# Patient Record
Sex: Female | Born: 1935 | Race: White | Hispanic: No | Smoking: Former smoker
Health system: Southern US, Community
[De-identification: ages and names within clinical notes are randomized; demographics above are authoritative.]

## PROBLEM LIST (undated history)

## (undated) DIAGNOSIS — E079 Disorder of thyroid, unspecified: Secondary | ICD-10-CM

## (undated) DIAGNOSIS — F32A Depression, unspecified: Secondary | ICD-10-CM

## (undated) DIAGNOSIS — M1990 Unspecified osteoarthritis, unspecified site: Secondary | ICD-10-CM

## (undated) DIAGNOSIS — U071 COVID-19: Secondary | ICD-10-CM

## (undated) DIAGNOSIS — I10 Essential (primary) hypertension: Secondary | ICD-10-CM

## (undated) DIAGNOSIS — E119 Type 2 diabetes mellitus without complications: Secondary | ICD-10-CM

## (undated) DIAGNOSIS — M549 Dorsalgia, unspecified: Secondary | ICD-10-CM

## (undated) DIAGNOSIS — F419 Anxiety disorder, unspecified: Secondary | ICD-10-CM

## (undated) DIAGNOSIS — N816 Rectocele: Secondary | ICD-10-CM

## (undated) DIAGNOSIS — K5900 Constipation, unspecified: Secondary | ICD-10-CM

## (undated) DIAGNOSIS — E785 Hyperlipidemia, unspecified: Secondary | ICD-10-CM

## (undated) DIAGNOSIS — K219 Gastro-esophageal reflux disease without esophagitis: Secondary | ICD-10-CM

## (undated) DIAGNOSIS — IMO0002 Reserved for concepts with insufficient information to code with codable children: Secondary | ICD-10-CM

## (undated) DIAGNOSIS — F329 Major depressive disorder, single episode, unspecified: Secondary | ICD-10-CM

## (undated) HISTORY — DX: Depression, unspecified: F32.A

## (undated) HISTORY — DX: Essential (primary) hypertension: I10

## (undated) HISTORY — DX: Unspecified osteoarthritis, unspecified site: M19.90

## (undated) HISTORY — DX: Disorder of thyroid, unspecified: E07.9

## (undated) HISTORY — DX: Dorsalgia, unspecified: M54.9

## (undated) HISTORY — DX: Gastro-esophageal reflux disease without esophagitis: K21.9

## (undated) HISTORY — DX: Major depressive disorder, single episode, unspecified: F32.9

## (undated) HISTORY — DX: Constipation, unspecified: K59.00

## (undated) HISTORY — DX: Rectocele: N81.6

## (undated) HISTORY — PX: CATARACT EXTRACTION: SUR2

## (undated) HISTORY — DX: Anxiety disorder, unspecified: F41.9

## (undated) HISTORY — DX: Hyperlipidemia, unspecified: E78.5

## (undated) HISTORY — DX: Type 2 diabetes mellitus without complications: E11.9

## (undated) HISTORY — DX: Reserved for concepts with insufficient information to code with codable children: IMO0002

## (undated) HISTORY — PX: ABDOMINAL HYSTERECTOMY: SHX81

---

## 2002-06-02 HISTORY — PX: COLONOSCOPY: SHX174

## 2002-12-13 ENCOUNTER — Encounter: Payer: Self-pay | Admitting: Family Medicine

## 2002-12-13 ENCOUNTER — Ambulatory Visit (HOSPITAL_COMMUNITY): Admission: RE | Admit: 2002-12-13 | Discharge: 2002-12-13 | Payer: Self-pay | Admitting: Family Medicine

## 2003-04-06 ENCOUNTER — Ambulatory Visit (HOSPITAL_COMMUNITY): Admission: RE | Admit: 2003-04-06 | Discharge: 2003-04-06 | Payer: Self-pay | Admitting: Family Medicine

## 2003-04-14 ENCOUNTER — Ambulatory Visit (HOSPITAL_COMMUNITY): Admission: RE | Admit: 2003-04-14 | Discharge: 2003-04-14 | Payer: Self-pay | Admitting: Family Medicine

## 2003-05-24 ENCOUNTER — Ambulatory Visit (HOSPITAL_COMMUNITY): Admission: RE | Admit: 2003-05-24 | Discharge: 2003-05-24 | Payer: Self-pay | Admitting: Internal Medicine

## 2003-05-24 ENCOUNTER — Encounter (INDEPENDENT_AMBULATORY_CARE_PROVIDER_SITE_OTHER): Payer: Self-pay | Admitting: Internal Medicine

## 2003-10-09 ENCOUNTER — Ambulatory Visit (HOSPITAL_COMMUNITY): Admission: RE | Admit: 2003-10-09 | Discharge: 2003-10-09 | Payer: Self-pay | Admitting: Family Medicine

## 2003-12-14 ENCOUNTER — Ambulatory Visit (HOSPITAL_COMMUNITY): Admission: RE | Admit: 2003-12-14 | Discharge: 2003-12-14 | Payer: Self-pay | Admitting: Family Medicine

## 2004-04-04 ENCOUNTER — Ambulatory Visit (HOSPITAL_COMMUNITY): Admission: RE | Admit: 2004-04-04 | Discharge: 2004-04-04 | Payer: Self-pay | Admitting: Internal Medicine

## 2004-05-31 ENCOUNTER — Ambulatory Visit (HOSPITAL_COMMUNITY): Admission: RE | Admit: 2004-05-31 | Discharge: 2004-05-31 | Payer: Self-pay | Admitting: Internal Medicine

## 2004-09-24 ENCOUNTER — Ambulatory Visit (HOSPITAL_COMMUNITY): Admission: RE | Admit: 2004-09-24 | Discharge: 2004-09-24 | Payer: Self-pay | Admitting: Internal Medicine

## 2004-11-30 ENCOUNTER — Encounter (INDEPENDENT_AMBULATORY_CARE_PROVIDER_SITE_OTHER): Payer: Self-pay | Admitting: Internal Medicine

## 2004-11-30 LAB — CONVERTED CEMR LAB: Pap Smear: NORMAL

## 2004-12-19 ENCOUNTER — Ambulatory Visit: Payer: Self-pay | Admitting: Family Medicine

## 2004-12-20 ENCOUNTER — Ambulatory Visit (HOSPITAL_COMMUNITY): Admission: RE | Admit: 2004-12-20 | Discharge: 2004-12-20 | Payer: Self-pay | Admitting: Family Medicine

## 2004-12-24 ENCOUNTER — Ambulatory Visit (HOSPITAL_COMMUNITY): Admission: RE | Admit: 2004-12-24 | Discharge: 2004-12-24 | Payer: Self-pay | Admitting: Family Medicine

## 2004-12-26 ENCOUNTER — Encounter (INDEPENDENT_AMBULATORY_CARE_PROVIDER_SITE_OTHER): Payer: Self-pay | Admitting: Internal Medicine

## 2004-12-26 LAB — CONVERTED CEMR LAB: TSH: 3.325 microintl units/mL

## 2005-01-21 ENCOUNTER — Ambulatory Visit: Payer: Self-pay | Admitting: Family Medicine

## 2005-02-24 ENCOUNTER — Ambulatory Visit: Payer: Self-pay | Admitting: Family Medicine

## 2005-02-24 ENCOUNTER — Ambulatory Visit (HOSPITAL_COMMUNITY): Admission: RE | Admit: 2005-02-24 | Discharge: 2005-02-24 | Payer: Self-pay | Admitting: Family Medicine

## 2005-03-27 ENCOUNTER — Ambulatory Visit: Payer: Self-pay | Admitting: Family Medicine

## 2005-04-22 ENCOUNTER — Ambulatory Visit: Payer: Self-pay | Admitting: Family Medicine

## 2005-05-19 ENCOUNTER — Ambulatory Visit: Payer: Self-pay | Admitting: Family Medicine

## 2005-09-04 ENCOUNTER — Ambulatory Visit: Payer: Self-pay | Admitting: Internal Medicine

## 2005-10-02 ENCOUNTER — Ambulatory Visit: Payer: Self-pay | Admitting: Internal Medicine

## 2005-10-07 ENCOUNTER — Encounter (INDEPENDENT_AMBULATORY_CARE_PROVIDER_SITE_OTHER): Payer: Self-pay | Admitting: Internal Medicine

## 2005-10-16 ENCOUNTER — Ambulatory Visit: Payer: Self-pay | Admitting: Internal Medicine

## 2005-11-13 ENCOUNTER — Ambulatory Visit: Payer: Self-pay | Admitting: Internal Medicine

## 2005-12-01 ENCOUNTER — Ambulatory Visit: Payer: Self-pay | Admitting: Internal Medicine

## 2005-12-01 LAB — CONVERTED CEMR LAB
BUN: 18 mg/dL
CO2: 29 meq/L
Calcium: 9.4 mg/dL
Chloride: 100 meq/L
Cholesterol: 186 mg/dL
Creatinine, Ser: 0.81 mg/dL
Glucose, Bld: 115 mg/dL
HDL: 44 mg/dL
LDL Cholesterol: 122 mg/dL
Potassium: 4.1 meq/L
Sodium: 141 meq/L
Total CHOL/HDL Ratio: 4.2
Triglycerides: 101 mg/dL
VLDL: 20 mg/dL

## 2005-12-25 ENCOUNTER — Encounter (INDEPENDENT_AMBULATORY_CARE_PROVIDER_SITE_OTHER): Payer: Self-pay | Admitting: Internal Medicine

## 2005-12-25 ENCOUNTER — Ambulatory Visit (HOSPITAL_COMMUNITY): Admission: RE | Admit: 2005-12-25 | Discharge: 2005-12-25 | Payer: Self-pay | Admitting: Internal Medicine

## 2006-02-16 ENCOUNTER — Ambulatory Visit: Payer: Self-pay | Admitting: Internal Medicine

## 2006-03-05 ENCOUNTER — Ambulatory Visit: Payer: Self-pay | Admitting: Internal Medicine

## 2006-03-17 ENCOUNTER — Ambulatory Visit: Payer: Self-pay | Admitting: Internal Medicine

## 2006-03-17 LAB — CONVERTED CEMR LAB: Blood Glucose, Fasting: 126 mg/dL

## 2006-06-06 ENCOUNTER — Encounter: Payer: Self-pay | Admitting: Internal Medicine

## 2006-06-06 DIAGNOSIS — I10 Essential (primary) hypertension: Secondary | ICD-10-CM | POA: Insufficient documentation

## 2006-06-06 DIAGNOSIS — Z8639 Personal history of other endocrine, nutritional and metabolic disease: Secondary | ICD-10-CM

## 2006-06-06 DIAGNOSIS — K5900 Constipation, unspecified: Secondary | ICD-10-CM | POA: Insufficient documentation

## 2006-06-06 DIAGNOSIS — M129 Arthropathy, unspecified: Secondary | ICD-10-CM | POA: Insufficient documentation

## 2006-06-06 DIAGNOSIS — H269 Unspecified cataract: Secondary | ICD-10-CM

## 2006-06-06 DIAGNOSIS — M545 Low back pain: Secondary | ICD-10-CM

## 2006-06-06 DIAGNOSIS — K219 Gastro-esophageal reflux disease without esophagitis: Secondary | ICD-10-CM

## 2006-06-06 DIAGNOSIS — N6019 Diffuse cystic mastopathy of unspecified breast: Secondary | ICD-10-CM

## 2006-06-06 DIAGNOSIS — F3289 Other specified depressive episodes: Secondary | ICD-10-CM | POA: Insufficient documentation

## 2006-06-06 DIAGNOSIS — K589 Irritable bowel syndrome without diarrhea: Secondary | ICD-10-CM

## 2006-06-06 DIAGNOSIS — F411 Generalized anxiety disorder: Secondary | ICD-10-CM | POA: Insufficient documentation

## 2006-06-06 DIAGNOSIS — H3530 Unspecified macular degeneration: Secondary | ICD-10-CM | POA: Insufficient documentation

## 2006-06-06 DIAGNOSIS — F329 Major depressive disorder, single episode, unspecified: Secondary | ICD-10-CM

## 2006-06-06 DIAGNOSIS — Z862 Personal history of diseases of the blood and blood-forming organs and certain disorders involving the immune mechanism: Secondary | ICD-10-CM

## 2006-06-06 DIAGNOSIS — J40 Bronchitis, not specified as acute or chronic: Secondary | ICD-10-CM | POA: Insufficient documentation

## 2006-06-09 ENCOUNTER — Ambulatory Visit: Payer: Self-pay | Admitting: Internal Medicine

## 2006-09-10 ENCOUNTER — Encounter (INDEPENDENT_AMBULATORY_CARE_PROVIDER_SITE_OTHER): Payer: Self-pay | Admitting: Internal Medicine

## 2006-10-12 ENCOUNTER — Telehealth (INDEPENDENT_AMBULATORY_CARE_PROVIDER_SITE_OTHER): Payer: Self-pay | Admitting: *Deleted

## 2006-10-14 ENCOUNTER — Ambulatory Visit: Payer: Self-pay | Admitting: Internal Medicine

## 2006-11-11 ENCOUNTER — Ambulatory Visit: Payer: Self-pay | Admitting: Internal Medicine

## 2006-11-12 LAB — CONVERTED CEMR LAB
BUN: 19 mg/dL (ref 6–23)
Basophils Absolute: 0 10*3/uL (ref 0.0–0.1)
Basophils Relative: 0 % (ref 0–1)
CO2: 25 meq/L (ref 19–32)
Calcium: 9.3 mg/dL (ref 8.4–10.5)
Chloride: 104 meq/L (ref 96–112)
Cholesterol: 162 mg/dL (ref 0–200)
Creatinine, Ser: 0.82 mg/dL (ref 0.40–1.20)
Eosinophils Absolute: 0.2 10*3/uL (ref 0.0–0.7)
Eosinophils Relative: 2 % (ref 0–5)
Glucose, Bld: 111 mg/dL — ABNORMAL HIGH (ref 70–99)
HCT: 44.3 % (ref 36.0–46.0)
HDL: 47 mg/dL (ref >39)
Hemoglobin: 14.2 g/dL (ref 12.0–15.0)
LDL Cholesterol: 98 mg/dL (ref 0–99)
Lymphocytes Relative: 28 % (ref 12–46)
Lymphs Abs: 2 10*3/uL (ref 0.7–3.3)
MCHC: 32.1 g/dL (ref 30.0–36.0)
MCV: 85.7 fL (ref 78.0–100.0)
Monocytes Absolute: 0.5 10*3/uL (ref 0.2–0.7)
Monocytes Relative: 7 % (ref 3–11)
Neutro Abs: 4.3 10*3/uL (ref 1.7–7.7)
Neutrophils Relative %: 62 % (ref 43–77)
Platelets: 193 10*3/uL (ref 150–400)
Potassium: 4.5 meq/L (ref 3.5–5.3)
RBC: 5.17 M/uL — ABNORMAL HIGH (ref 3.87–5.11)
RDW: 14.5 % — ABNORMAL HIGH (ref 11.5–14.0)
Sodium: 141 meq/L (ref 135–145)
Total CHOL/HDL Ratio: 3.4
Triglycerides: 87 mg/dL (ref <150)
VLDL: 17 mg/dL (ref 0–40)
WBC: 7 10*3/uL (ref 4.0–10.5)

## 2006-11-26 ENCOUNTER — Telehealth (INDEPENDENT_AMBULATORY_CARE_PROVIDER_SITE_OTHER): Payer: Self-pay | Admitting: *Deleted

## 2006-12-08 ENCOUNTER — Encounter (INDEPENDENT_AMBULATORY_CARE_PROVIDER_SITE_OTHER): Payer: Self-pay | Admitting: Internal Medicine

## 2006-12-08 ENCOUNTER — Ambulatory Visit: Payer: Self-pay | Admitting: Family Medicine

## 2006-12-08 DIAGNOSIS — R0789 Other chest pain: Secondary | ICD-10-CM

## 2006-12-08 LAB — CONVERTED CEMR LAB
Bilirubin Urine: NEGATIVE
Blood in Urine, dipstick: NEGATIVE
Glucose, Urine, Semiquant: NEGATIVE
Ketones, urine, test strip: NEGATIVE
Nitrite: NEGATIVE
Protein, U semiquant: NEGATIVE
Specific Gravity, Urine: >=1.03
Urobilinogen, UA: 0.2
pH: 5.5

## 2006-12-10 ENCOUNTER — Ambulatory Visit: Payer: Self-pay | Admitting: Internal Medicine

## 2006-12-14 ENCOUNTER — Telehealth (INDEPENDENT_AMBULATORY_CARE_PROVIDER_SITE_OTHER): Payer: Self-pay | Admitting: *Deleted

## 2006-12-22 ENCOUNTER — Ambulatory Visit: Payer: Self-pay | Admitting: Internal Medicine

## 2006-12-28 ENCOUNTER — Encounter (INDEPENDENT_AMBULATORY_CARE_PROVIDER_SITE_OTHER): Payer: Self-pay | Admitting: Family Medicine

## 2006-12-28 ENCOUNTER — Ambulatory Visit (HOSPITAL_COMMUNITY): Admission: RE | Admit: 2006-12-28 | Discharge: 2006-12-28 | Payer: Self-pay | Admitting: Family Medicine

## 2006-12-29 ENCOUNTER — Telehealth (INDEPENDENT_AMBULATORY_CARE_PROVIDER_SITE_OTHER): Payer: Self-pay | Admitting: *Deleted

## 2007-02-02 ENCOUNTER — Ambulatory Visit: Payer: Self-pay | Admitting: Internal Medicine

## 2007-02-03 ENCOUNTER — Ambulatory Visit (HOSPITAL_COMMUNITY): Admission: RE | Admit: 2007-02-03 | Discharge: 2007-02-03 | Payer: Self-pay | Admitting: Internal Medicine

## 2007-02-05 ENCOUNTER — Ambulatory Visit: Payer: Self-pay | Admitting: Internal Medicine

## 2007-02-10 ENCOUNTER — Encounter (INDEPENDENT_AMBULATORY_CARE_PROVIDER_SITE_OTHER): Payer: Self-pay | Admitting: Internal Medicine

## 2007-03-02 ENCOUNTER — Ambulatory Visit: Payer: Self-pay | Admitting: Internal Medicine

## 2007-04-08 ENCOUNTER — Ambulatory Visit: Payer: Self-pay | Admitting: Internal Medicine

## 2007-04-08 LAB — CONVERTED CEMR LAB
Bilirubin Urine: NEGATIVE
Blood in Urine, dipstick: NEGATIVE
Glucose, Urine, Semiquant: NEGATIVE
Nitrite: NEGATIVE
Protein, U semiquant: NEGATIVE
Specific Gravity, Urine: >=1.03
Urobilinogen, UA: 0.2
pH: 5

## 2007-04-13 ENCOUNTER — Ambulatory Visit (HOSPITAL_COMMUNITY): Admission: RE | Admit: 2007-04-13 | Discharge: 2007-04-13 | Payer: Self-pay | Admitting: Ophthalmology

## 2007-05-21 ENCOUNTER — Encounter (INDEPENDENT_AMBULATORY_CARE_PROVIDER_SITE_OTHER): Payer: Self-pay | Admitting: Internal Medicine

## 2007-05-24 ENCOUNTER — Telehealth (INDEPENDENT_AMBULATORY_CARE_PROVIDER_SITE_OTHER): Payer: Self-pay | Admitting: *Deleted

## 2007-05-24 LAB — CONVERTED CEMR LAB
BUN: 16 mg/dL (ref 6–23)
Calcium: 9.7 mg/dL (ref 8.4–10.5)
Creatinine, Ser: 0.82 mg/dL (ref 0.40–1.20)

## 2007-06-03 ENCOUNTER — Encounter: Payer: Self-pay | Admitting: Family Medicine

## 2007-07-01 ENCOUNTER — Ambulatory Visit: Payer: Self-pay | Admitting: Internal Medicine

## 2007-07-01 DIAGNOSIS — B029 Zoster without complications: Secondary | ICD-10-CM | POA: Insufficient documentation

## 2007-07-05 ENCOUNTER — Telehealth (INDEPENDENT_AMBULATORY_CARE_PROVIDER_SITE_OTHER): Payer: Self-pay | Admitting: Internal Medicine

## 2007-07-13 ENCOUNTER — Ambulatory Visit: Payer: Self-pay | Admitting: Family Medicine

## 2007-07-16 ENCOUNTER — Ambulatory Visit: Payer: Self-pay | Admitting: Internal Medicine

## 2007-07-16 DIAGNOSIS — E861 Hypovolemia: Secondary | ICD-10-CM

## 2007-07-30 ENCOUNTER — Ambulatory Visit: Payer: Self-pay | Admitting: Internal Medicine

## 2007-08-11 ENCOUNTER — Ambulatory Visit: Payer: Self-pay | Admitting: Internal Medicine

## 2007-09-07 ENCOUNTER — Ambulatory Visit: Payer: Self-pay | Admitting: Internal Medicine

## 2007-10-07 ENCOUNTER — Ambulatory Visit: Payer: Self-pay | Admitting: Internal Medicine

## 2007-11-18 ENCOUNTER — Ambulatory Visit: Payer: Self-pay | Admitting: Internal Medicine

## 2007-12-30 ENCOUNTER — Ambulatory Visit: Payer: Self-pay | Admitting: Internal Medicine

## 2007-12-30 ENCOUNTER — Ambulatory Visit (HOSPITAL_COMMUNITY): Admission: RE | Admit: 2007-12-30 | Discharge: 2007-12-30 | Payer: Self-pay | Admitting: Internal Medicine

## 2007-12-30 LAB — CONVERTED CEMR LAB: Hgb A1c MFr Bld: 6.7 %

## 2008-01-10 ENCOUNTER — Telehealth (INDEPENDENT_AMBULATORY_CARE_PROVIDER_SITE_OTHER): Payer: Self-pay | Admitting: Internal Medicine

## 2008-01-10 ENCOUNTER — Ambulatory Visit (HOSPITAL_COMMUNITY): Admission: RE | Admit: 2008-01-10 | Discharge: 2008-01-10 | Payer: Self-pay | Admitting: Internal Medicine

## 2008-01-11 ENCOUNTER — Encounter (INDEPENDENT_AMBULATORY_CARE_PROVIDER_SITE_OTHER): Payer: Self-pay | Admitting: Internal Medicine

## 2008-01-11 ENCOUNTER — Encounter (INDEPENDENT_AMBULATORY_CARE_PROVIDER_SITE_OTHER): Payer: Self-pay | Admitting: Diagnostic Radiology

## 2008-01-11 ENCOUNTER — Encounter: Admission: RE | Admit: 2008-01-11 | Discharge: 2008-01-11 | Payer: Self-pay | Admitting: Internal Medicine

## 2008-01-17 ENCOUNTER — Encounter (INDEPENDENT_AMBULATORY_CARE_PROVIDER_SITE_OTHER): Payer: Self-pay | Admitting: Internal Medicine

## 2008-01-18 ENCOUNTER — Ambulatory Visit: Payer: Self-pay | Admitting: Internal Medicine

## 2008-02-17 ENCOUNTER — Ambulatory Visit: Payer: Self-pay | Admitting: Internal Medicine

## 2008-03-30 ENCOUNTER — Ambulatory Visit: Payer: Self-pay | Admitting: Internal Medicine

## 2008-03-30 DIAGNOSIS — E1149 Type 2 diabetes mellitus with other diabetic neurological complication: Secondary | ICD-10-CM

## 2008-04-04 ENCOUNTER — Encounter (INDEPENDENT_AMBULATORY_CARE_PROVIDER_SITE_OTHER): Payer: Self-pay | Admitting: Internal Medicine

## 2008-04-11 ENCOUNTER — Encounter (INDEPENDENT_AMBULATORY_CARE_PROVIDER_SITE_OTHER): Payer: Self-pay | Admitting: Internal Medicine

## 2008-07-04 ENCOUNTER — Encounter (INDEPENDENT_AMBULATORY_CARE_PROVIDER_SITE_OTHER): Payer: Self-pay | Admitting: Internal Medicine

## 2008-07-10 ENCOUNTER — Ambulatory Visit: Payer: Self-pay | Admitting: Internal Medicine

## 2008-07-10 DIAGNOSIS — R5381 Other malaise: Secondary | ICD-10-CM

## 2008-07-10 DIAGNOSIS — R5383 Other fatigue: Secondary | ICD-10-CM

## 2008-07-10 LAB — CONVERTED CEMR LAB: Hgb A1c MFr Bld: 6.7 %

## 2008-07-11 LAB — CONVERTED CEMR LAB
Alkaline Phosphatase: 103 units/L (ref 39–117)
BUN: 14 mg/dL (ref 6–23)
CO2: 25 meq/L (ref 19–32)
Cholesterol: 157 mg/dL (ref 0–200)
Creatinine, Ser: 0.8 mg/dL (ref 0.40–1.20)
Eosinophils Absolute: 0.1 10*3/uL (ref 0.0–0.7)
Eosinophils Relative: 2 % (ref 0–5)
Glucose, Bld: 90 mg/dL (ref 70–99)
HCT: 45.1 % (ref 36.0–46.0)
HDL: 46 mg/dL (ref >39)
Hemoglobin: 14.9 g/dL (ref 12.0–15.0)
LDL Cholesterol: 86 mg/dL (ref 0–99)
Lymphocytes Relative: 26 % (ref 12–46)
Lymphs Abs: 1.9 10*3/uL (ref 0.7–4.0)
MCV: 84.5 fL (ref 78.0–100.0)
Monocytes Absolute: 0.5 10*3/uL (ref 0.1–1.0)
Monocytes Relative: 6 % (ref 3–12)
Platelets: 214 10*3/uL (ref 150–400)
RBC: 5.34 M/uL — ABNORMAL HIGH (ref 3.87–5.11)
Total Bilirubin: 0.5 mg/dL (ref 0.3–1.2)
Total Protein: 7.3 g/dL (ref 6.0–8.3)
Triglycerides: 125 mg/dL (ref <150)
VLDL: 25 mg/dL (ref 0–40)
WBC: 7.4 10*3/uL (ref 4.0–10.5)

## 2008-07-18 ENCOUNTER — Ambulatory Visit: Payer: Self-pay | Admitting: Internal Medicine

## 2008-07-18 DIAGNOSIS — R300 Dysuria: Secondary | ICD-10-CM

## 2008-07-18 DIAGNOSIS — G4700 Insomnia, unspecified: Secondary | ICD-10-CM

## 2008-07-18 LAB — CONVERTED CEMR LAB
Amphetamine Screen, Ur: NEGATIVE
Benzodiazepines.: NEGATIVE
Bilirubin Urine: NEGATIVE
Marijuana Metabolite: NEGATIVE
Nitrite: NEGATIVE
Opiate Screen, Urine: NEGATIVE
Phencyclidine (PCP): NEGATIVE
Urobilinogen, UA: NEGATIVE

## 2008-07-19 ENCOUNTER — Encounter (INDEPENDENT_AMBULATORY_CARE_PROVIDER_SITE_OTHER): Payer: Self-pay | Admitting: Internal Medicine

## 2008-08-07 ENCOUNTER — Ambulatory Visit: Payer: Self-pay | Admitting: Internal Medicine

## 2008-08-16 ENCOUNTER — Telehealth (INDEPENDENT_AMBULATORY_CARE_PROVIDER_SITE_OTHER): Payer: Self-pay | Admitting: Internal Medicine

## 2008-08-19 ENCOUNTER — Emergency Department (HOSPITAL_COMMUNITY): Admission: EM | Admit: 2008-08-19 | Discharge: 2008-08-19 | Payer: Self-pay | Admitting: Emergency Medicine

## 2008-08-21 ENCOUNTER — Ambulatory Visit: Payer: Self-pay | Admitting: Internal Medicine

## 2008-08-30 ENCOUNTER — Ambulatory Visit: Payer: Self-pay | Admitting: Internal Medicine

## 2008-09-04 ENCOUNTER — Ambulatory Visit: Payer: Self-pay | Admitting: Internal Medicine

## 2008-09-12 ENCOUNTER — Telehealth (INDEPENDENT_AMBULATORY_CARE_PROVIDER_SITE_OTHER): Payer: Self-pay | Admitting: Internal Medicine

## 2008-09-27 ENCOUNTER — Ambulatory Visit: Payer: Self-pay | Admitting: Internal Medicine

## 2008-10-11 ENCOUNTER — Ambulatory Visit: Payer: Self-pay | Admitting: Internal Medicine

## 2008-10-12 ENCOUNTER — Telehealth (INDEPENDENT_AMBULATORY_CARE_PROVIDER_SITE_OTHER): Payer: Self-pay | Admitting: Internal Medicine

## 2008-10-25 ENCOUNTER — Ambulatory Visit: Payer: Self-pay | Admitting: Internal Medicine

## 2008-10-26 ENCOUNTER — Encounter (INDEPENDENT_AMBULATORY_CARE_PROVIDER_SITE_OTHER): Payer: Self-pay | Admitting: Internal Medicine

## 2008-10-26 LAB — CONVERTED CEMR LAB
ALT: 16 units/L (ref 0–35)
BUN: 15 mg/dL (ref 6–23)
Basophils Relative: 0 % (ref 0–1)
CO2: 27 meq/L (ref 19–32)
Calcium: 9.2 mg/dL (ref 8.4–10.5)
Chloride: 105 meq/L (ref 96–112)
Creatinine, Ser: 0.78 mg/dL (ref 0.40–1.20)
Eosinophils Absolute: 0.2 10*3/uL (ref 0.0–0.7)
Eosinophils Relative: 2 % (ref 0–5)
Free T4: 0.91 ng/dL (ref 0.80–1.80)
Glucose, Bld: 102 mg/dL — ABNORMAL HIGH (ref 70–99)
HCT: 45 % (ref 36.0–46.0)
Lymphs Abs: 2.2 10*3/uL (ref 0.7–4.0)
MCHC: 33.1 g/dL (ref 30.0–36.0)
MCV: 84.6 fL (ref 78.0–100.0)
Monocytes Absolute: 0.5 10*3/uL (ref 0.1–1.0)
Monocytes Relative: 6 % (ref 3–12)
RBC: 5.32 M/uL — ABNORMAL HIGH (ref 3.87–5.11)
TSH: 2.516 microintl units/mL (ref 0.350–4.500)
WBC: 7.9 10*3/uL (ref 4.0–10.5)

## 2008-11-08 ENCOUNTER — Ambulatory Visit: Payer: Self-pay | Admitting: Internal Medicine

## 2008-11-08 LAB — CONVERTED CEMR LAB
Blood Glucose, AC Bkfst: 86 mg/dL
Hgb A1c MFr Bld: 6.7 %

## 2008-11-27 ENCOUNTER — Ambulatory Visit: Payer: Self-pay | Admitting: Internal Medicine

## 2008-12-01 ENCOUNTER — Encounter (INDEPENDENT_AMBULATORY_CARE_PROVIDER_SITE_OTHER): Payer: Self-pay | Admitting: *Deleted

## 2008-12-21 DIAGNOSIS — R109 Unspecified abdominal pain: Secondary | ICD-10-CM | POA: Insufficient documentation

## 2008-12-25 ENCOUNTER — Ambulatory Visit: Payer: Self-pay | Admitting: Internal Medicine

## 2008-12-28 ENCOUNTER — Ambulatory Visit: Payer: Self-pay | Admitting: Internal Medicine

## 2008-12-29 ENCOUNTER — Encounter: Payer: Self-pay | Admitting: Internal Medicine

## 2009-01-01 ENCOUNTER — Encounter: Payer: Self-pay | Admitting: Internal Medicine

## 2009-01-01 ENCOUNTER — Ambulatory Visit (HOSPITAL_COMMUNITY): Admission: RE | Admit: 2009-01-01 | Discharge: 2009-01-01 | Payer: Self-pay | Admitting: Internal Medicine

## 2009-01-01 ENCOUNTER — Encounter (INDEPENDENT_AMBULATORY_CARE_PROVIDER_SITE_OTHER): Payer: Self-pay | Admitting: Internal Medicine

## 2009-01-01 ENCOUNTER — Ambulatory Visit: Payer: Self-pay | Admitting: Internal Medicine

## 2009-01-01 HISTORY — PX: ESOPHAGOGASTRODUODENOSCOPY: SHX1529

## 2009-01-03 ENCOUNTER — Ambulatory Visit (HOSPITAL_COMMUNITY): Admission: RE | Admit: 2009-01-03 | Discharge: 2009-01-03 | Payer: Self-pay | Admitting: Internal Medicine

## 2009-01-04 ENCOUNTER — Encounter (INDEPENDENT_AMBULATORY_CARE_PROVIDER_SITE_OTHER): Payer: Self-pay | Admitting: *Deleted

## 2009-01-04 ENCOUNTER — Encounter: Payer: Self-pay | Admitting: Internal Medicine

## 2009-01-22 ENCOUNTER — Ambulatory Visit: Payer: Self-pay | Admitting: Internal Medicine

## 2009-01-30 ENCOUNTER — Ambulatory Visit (HOSPITAL_COMMUNITY): Admission: RE | Admit: 2009-01-30 | Discharge: 2009-01-30 | Payer: Self-pay | Admitting: Family Medicine

## 2009-02-13 ENCOUNTER — Ambulatory Visit: Payer: Self-pay | Admitting: Internal Medicine

## 2009-02-14 DIAGNOSIS — K3184 Gastroparesis: Secondary | ICD-10-CM

## 2010-01-31 ENCOUNTER — Ambulatory Visit (HOSPITAL_COMMUNITY): Admission: RE | Admit: 2010-01-31 | Discharge: 2010-01-31 | Payer: Self-pay | Admitting: Family Medicine

## 2010-06-24 ENCOUNTER — Encounter: Payer: Self-pay | Admitting: Internal Medicine

## 2010-06-30 LAB — CONVERTED CEMR LAB
BUN: 15 mg/dL (ref 6–23)
Basophils Relative: 0 % (ref 0–1)
CO2: 25 meq/L (ref 19–32)
Chloride: 103 meq/L (ref 96–112)
Glucose, Bld: 133 mg/dL — ABNORMAL HIGH (ref 70–99)
Hemoglobin: 14 g/dL (ref 12.0–15.0)
Hgb A1c MFr Bld: 6.4 %
Ketones, urine, test strip: NEGATIVE
LDL Cholesterol: 107 mg/dL — ABNORMAL HIGH (ref 0–99)
Lymphocytes Relative: 33 % (ref 12–46)
Lymphs Abs: 1.9 10*3/uL (ref 0.7–4.0)
Microalb Creat Ratio: 12.9 mg/g (ref 0.0–30.0)
Monocytes Relative: 7 % (ref 3–12)
Neutro Abs: 3.1 10*3/uL (ref 1.7–7.7)
Neutrophils Relative %: 55 % (ref 43–77)
Potassium: 4.3 meq/L (ref 3.5–5.3)
RBC: 5.04 M/uL (ref 3.87–5.11)
Specific Gravity, Urine: 1.025
TSH: 3.319 microintl units/mL (ref 0.350–5.50)
Triglycerides: 118 mg/dL (ref <150)
Urobilinogen, UA: 0.2
VLDL: 24 mg/dL (ref 0–40)
WBC: 5.6 10*3/uL (ref 4.0–10.5)
pH: 5

## 2010-07-04 NOTE — Letter (Signed)
Summary: Historic Patient File  Historic Patient File   Imported By: Lind Guest 06/07/2010 15:54:31  _____________________________________________________________________  External Attachment:    Type:   Image     Comment:   External Document

## 2010-09-07 LAB — GLUCOSE, CAPILLARY: Glucose-Capillary: 117 mg/dL — ABNORMAL HIGH (ref 70–99)

## 2010-10-15 NOTE — Op Note (Signed)
NAMEDARRIA, CORVERA               ACCOUNT NO.:  [id number]   MEDICAL RECORD NO.:  [id number]          PATIENT TYPE:  AMB   LOCATION:  DAY                           FACILITY:  [facility]   PHYSICIAN:  R. Roetta Sessions, M.D. DATE OF BIRTH:  09-Nov-1935   DATE OF PROCEDURE:  01/01/2009  DATE OF DISCHARGE:                               OPERATIVE REPORT   PROCEDURE:  Esophagogastroduodenoscopy with biopsy.   INDICATIONS FOR PROCEDURE:  A 75 year old lady with nausea, anorexia,  weight loss.  She is diabetic.  EGD is now being done to further  evaluate her symptoms.  She does not have any odynophagia or dysphagia.  Risks, benefits, alternatives and limitations have been reviewed,  questions answered.  Please see the documentation in the medical record.   PROCEDURE NOTE:  O2 saturation, blood pressure, pulse and respirations  were monitored throughout entire procedure.  Conscious sedation:  Versed  4 mg IV, Demerol 100 mg IV in divided doses.  Instrument:  Pentax video  chip system.  Cetacaine spray for topical oropharyngeal anesthesia.   FINDINGS:  Examination of the tubular esophagus revealed normal-  appearing mucosa.  EG junction easily traversed.   Stomach:  Gastric cavity was empty and insufflated well with air.  A  thorough examination of the gastric mucosa including retroflexion of the  proximal stomach and esophagogastric junction demonstrated a moderate-  sized hiatal hernia and a 1-cm adenomatous-appearing nodule in the  antrum.  Otherwise, gastric mucosa appeared entirely normal.  Pylorus  was patent and easily traversed.  Examination of the bulb and second  portion revealed no abnormalities.   Therapeutic/diagnostic maneuvers:  Biopsies of the antral nodule were  taken for histologic study.  The patient tolerated the procedure well,  was reacted in endoscopy.   IMPRESSION:  1. Normal esophagus.  2. Moderate-sized hiatal hernia.  3. Antral nodule, status post biopsy.  4.  Patent pylorus, normal D1, D2.   RECOMMENDATIONS:  1. Follow-up on pathology.  2. Proceed with a solid-phase gastric emptying study to evaluate      further for gastroparesis.  3. Further recommendations to follow.      Jonathon Bellows, M.D.  Electronically Signed     RMR/MEDQ  D:  01/01/2009  T:  01/01/2009  Job:  [id number]   cc:   Dr. Jen Mow

## 2010-10-18 NOTE — Op Note (Signed)
NAME:  Natalie Long, ZAUCHA                         ACCOUNT NO.:  [id number]   MEDICAL RECORD NO.:  [id number]                   PATIENT TYPE:  AMB   LOCATION:  DAY                                  FACILITY:  [facility]   PHYSICIAN:  Lionel December, M.D.                 DATE OF BIRTH:  1936/06/02   DATE OF PROCEDURE:  05/24/2003  DATE OF DISCHARGE:                                 OPERATIVE REPORT   PROCEDURE:  Total colonoscopy.   ENDOSCOPIST:  Lionel December, M.D.   INDICATIONS:  Natalie Long is a 75 year old Caucasian female with abdominal pain  and constipation.  She had a barium enema on April 14, 2003.  It showed a  few diverticula at sigmoid colon and a persistent filling defect of the  sigmoid colon suspicious for a flat polyp.  She was, therefore, referred for  a colonoscopy.  She denies melena or rectal bleeding.  She also has negative  family history for CRC.  The procedure and risks were reviewed with the  patient and informed consent was obtained.   PREOPERATIVE MEDICATIONS:  Demerol 50 mg IV and Versed 6 mg IV in divided  dose.   FINDINGS:  Procedure performed in endoscopy suite.  The patient's vital  signs and O2 saturation were monitored during the procedure and remained  stable.  The patient was placed in the left lateral recumbent position and  rectal examination was performed.  No abnormality noted on external or  digital exam.   Olympus videoscope was placed in the rectum and advanced under vision into  the sigmoid colon and beyond.  Preparation was felt to be satisfactory.  Somewhat redundant colon with a few diverticula.  The scope was advanced to  the cecum which was identified by ileocecal valve and appendiceal  orifice/stump.  As the scope was withdrawn the colonic mucosa was, once  again, carefully examined.  The sigmoid colon, particularly, was examined a  few times and no polyp and/or mass was noted.  Rectal mucosa was normal.   The scope was retroflexed to examine  the anorectal junction which was  unremarkable.  The endoscope was straightened and withdrawn.  The patient  tolerated the procedure well.   FINAL DIAGNOSIS:  A few sigmoid colon diverticula, otherwise normal  colonoscopy.   RECOMMENDATIONS:  1. High fiber diet.  2. Citrucel 1 tablespoonful daily.  3. Lactulose 2 tablespoons full at bedtime.  4. She will follow with Dr. Janna Arch in a few weeks.      ___________________________________________                                            Lionel December, M.D.   NR/MEDQ  D:  05/24/2003  T:  05/25/2003  Job:  [id number]   cc:   Melvyn Novas,  MD  Fax: [phone]

## 2011-03-11 LAB — BASIC METABOLIC PANEL
Calcium: 8.9
GFR calc non Af Amer: >60
Glucose, Bld: 165 — ABNORMAL HIGH
Potassium: 3.7
Sodium: 140

## 2011-03-11 LAB — HEMOGLOBIN AND HEMATOCRIT, BLOOD: Hemoglobin: 12.8

## 2011-09-10 DIAGNOSIS — M8580 Other specified disorders of bone density and structure, unspecified site: Secondary | ICD-10-CM | POA: Insufficient documentation

## 2012-02-23 ENCOUNTER — Ambulatory Visit: Payer: Self-pay | Admitting: Family Medicine

## 2012-03-02 ENCOUNTER — Ambulatory Visit: Payer: Self-pay | Admitting: Family Medicine

## 2012-03-11 ENCOUNTER — Encounter: Payer: Self-pay | Admitting: Family Medicine

## 2012-03-11 ENCOUNTER — Ambulatory Visit: Payer: Medicare Other | Admitting: Family Medicine

## 2012-03-11 ENCOUNTER — Ambulatory Visit (INDEPENDENT_AMBULATORY_CARE_PROVIDER_SITE_OTHER): Payer: Medicare Other | Admitting: Family Medicine

## 2012-03-11 VITALS — BP 124/68 | HR 102 | Resp 18 | Ht 68.25 in | Wt 180.1 lb

## 2012-03-11 DIAGNOSIS — M545 Low back pain, unspecified: Secondary | ICD-10-CM

## 2012-03-11 DIAGNOSIS — G4700 Insomnia, unspecified: Secondary | ICD-10-CM

## 2012-03-11 DIAGNOSIS — N390 Urinary tract infection, site not specified: Secondary | ICD-10-CM

## 2012-03-11 DIAGNOSIS — F329 Major depressive disorder, single episode, unspecified: Secondary | ICD-10-CM

## 2012-03-11 DIAGNOSIS — E1149 Type 2 diabetes mellitus with other diabetic neurological complication: Secondary | ICD-10-CM

## 2012-03-11 DIAGNOSIS — I10 Essential (primary) hypertension: Secondary | ICD-10-CM

## 2012-03-11 DIAGNOSIS — N3000 Acute cystitis without hematuria: Secondary | ICD-10-CM

## 2012-03-11 DIAGNOSIS — E119 Type 2 diabetes mellitus without complications: Secondary | ICD-10-CM

## 2012-03-11 DIAGNOSIS — E785 Hyperlipidemia, unspecified: Secondary | ICD-10-CM

## 2012-03-11 DIAGNOSIS — Z23 Encounter for immunization: Secondary | ICD-10-CM

## 2012-03-11 DIAGNOSIS — E039 Hypothyroidism, unspecified: Secondary | ICD-10-CM

## 2012-03-11 DIAGNOSIS — F3289 Other specified depressive episodes: Secondary | ICD-10-CM

## 2012-03-11 DIAGNOSIS — F411 Generalized anxiety disorder: Secondary | ICD-10-CM

## 2012-03-11 LAB — LIPID PANEL
Cholesterol: 154 mg/dL (ref 0–200)
HDL: 42 mg/dL (ref >39)
LDL Cholesterol: 91 mg/dL (ref 0–99)
Total CHOL/HDL Ratio: 3.7 Ratio
Triglycerides: 107 mg/dL (ref <150)
VLDL: 21 mg/dL (ref 0–40)

## 2012-03-11 LAB — COMPREHENSIVE METABOLIC PANEL
AST: 20 U/L (ref 0–37)
Albumin: 4.3 g/dL (ref 3.5–5.2)
Alkaline Phosphatase: 85 U/L (ref 39–117)
BUN: 19 mg/dL (ref 6–23)
Glucose, Bld: 108 mg/dL — ABNORMAL HIGH (ref 70–99)
Potassium: 4.2 mEq/L (ref 3.5–5.3)
Sodium: 140 mEq/L (ref 135–145)
Total Bilirubin: 0.5 mg/dL (ref 0.3–1.2)

## 2012-03-11 LAB — CBC
Hemoglobin: 14.5 g/dL (ref 12.0–15.0)
MCH: 26.6 pg (ref 26.0–34.0)
MCHC: 34.4 g/dL (ref 30.0–36.0)
MCV: 77.4 fL — ABNORMAL LOW (ref 78.0–100.0)
RBC: 5.45 MIL/uL — ABNORMAL HIGH (ref 3.87–5.11)

## 2012-03-11 LAB — POCT URINALYSIS DIPSTICK
Bilirubin, UA: NEGATIVE
Blood, UA: NEGATIVE
Glucose, UA: NEGATIVE
Ketones, UA: NEGATIVE
Spec Grav, UA: 1.02
Urobilinogen, UA: 0.2

## 2012-03-11 MED ORDER — HYDROCHLOROTHIAZIDE 25 MG PO TABS: 25.0000 mg | ORAL_TABLET | Freq: Every day | ORAL | Status: DC

## 2012-03-11 MED ORDER — CLONAZEPAM 1 MG PO TABS: 1.0000 mg | ORAL_TABLET | Freq: Every evening | ORAL | Status: DC | PRN

## 2012-03-11 MED ORDER — CEPHALEXIN 500 MG PO CAPS: 500.0000 mg | ORAL_CAPSULE | Freq: Two times a day (BID) | ORAL | Status: AC

## 2012-03-11 MED ORDER — LEVOTHYROXINE SODIUM 50 MCG PO TABS: 50.0000 ug | ORAL_TABLET | Freq: Every day | ORAL | Status: DC

## 2012-03-11 MED ORDER — TRAMADOL HCL 50 MG PO TABS: 50.0000 mg | ORAL_TABLET | Freq: Four times a day (QID) | ORAL | Status: DC | PRN

## 2012-03-11 MED ORDER — SERTRALINE HCL 50 MG PO TABS: 50.0000 mg | ORAL_TABLET | Freq: Every day | ORAL | Status: DC

## 2012-03-11 MED ORDER — AMLODIPINE-OLMESARTAN 10-40 MG PO TABS: 1.0000 | ORAL_TABLET | Freq: Every day | ORAL | Status: DC

## 2012-03-11 NOTE — Progress Notes (Signed)
  Subjective:    Patient ID: Natalie Long, female    DOB: 1935-06-26, 76 y.o.   MRN: [id number]  HPI Patient here to establish care. Previous PCP was in [facility]. She is return to [place] Medications and history reviewed Anxiety/insomnia it appears she has been on Zoloft and clonazepam at bedtime to help her sleep. She stopped the Zoloft two-month weeks ago because she states she was feeling better when she moved to [place]. She was under a lot of stress and depression when she was back in [place] living with her daughter. She is asking if she needs to restart this medication Diabetes mellitus she's no longer on oral medications was previously on metformin. She is due for labs Back pain-she has chronic back pain and takes tramadol for this. She's had increased back pain over the past few days with some mild burning with urination. She denies any change in her bowels  Review of Systems  GEN- denies fatigue, fever, weight loss,weakness, recent illness HEENT- denies eye drainage, change in vision, nasal discharge, CVS- denies chest pain, palpitations RESP- denies SOB, cough, wheeze ABD- denies N/V, change in stools, abd pain GU- + dysuria, denies hematuria, dribbling, incontinence MSK- +joint pain, muscle aches, injury Neuro- denies headache, dizziness, syncope, seizure activity      Objective:   Physical Exam GEN- NAD, alert and oriented x3 HEENT- PERRL, EOMI, non injected sclera, pink conjunctiva, MMM, oropharynx clear, edentulous Neck- Supple,  CVS- RRR, no murmur RESP-CTAB ABD-NABS,soft,NT,ND, no cva tenderness EXT- No edema Back- TTP throacic lumbar region, decreased ROM, kyphosis noted Pulses- Radial, DP- 2+ Psych-normal affect and Mood       Assessment & Plan:  Natalie Caller MD  [phone]

## 2012-03-11 NOTE — Patient Instructions (Addendum)
Flu shot given  Medications refilled Get the bloodwork done  Antibiotic sent for urine infection for 3 days Restart the zoloft for your nerves F/U 2 months

## 2012-03-12 ENCOUNTER — Encounter: Payer: Self-pay | Admitting: Family Medicine

## 2012-03-12 DIAGNOSIS — E039 Hypothyroidism, unspecified: Secondary | ICD-10-CM | POA: Insufficient documentation

## 2012-03-12 DIAGNOSIS — N390 Urinary tract infection, site not specified: Secondary | ICD-10-CM | POA: Insufficient documentation

## 2012-03-12 NOTE — Assessment & Plan Note (Signed)
Continue current medications blood pressure looks okay today

## 2012-03-12 NOTE — Assessment & Plan Note (Signed)
She is a mild urinary symptoms however her back pain is worse therefore we'll go ahead and treat for urinary tract

## 2012-03-12 NOTE — Assessment & Plan Note (Signed)
Continue Synthroid I will obtain her recent labs

## 2012-03-12 NOTE — Assessment & Plan Note (Signed)
For now I will have her restart her Zoloft she understands that this is due to anxiety and depression symptoms

## 2012-03-12 NOTE — Assessment & Plan Note (Signed)
OA, with kyphosis, continue ultram

## 2012-03-12 NOTE — Assessment & Plan Note (Signed)
Per above 

## 2012-03-12 NOTE — Assessment & Plan Note (Signed)
No current medications. I will obtain her records

## 2012-03-12 NOTE — Assessment & Plan Note (Signed)
Continue Klonopin at bedtime, it appears she is on this medication for about depression and anxiety symptoms as well as insomnia. He'll plan a future would be up to get her off of this and use another medication

## 2012-03-26 ENCOUNTER — Encounter: Payer: Self-pay | Admitting: Family Medicine

## 2012-04-13 ENCOUNTER — Telehealth: Payer: Self-pay | Admitting: Internal Medicine

## 2012-04-14 MED ORDER — LEVOTHYROXINE SODIUM 50 MCG PO TABS: 50.0000 ug | ORAL_TABLET | Freq: Every day | ORAL | Status: DC

## 2012-04-14 MED ORDER — TRAMADOL HCL 50 MG PO TABS: 50.0000 mg | ORAL_TABLET | Freq: Four times a day (QID) | ORAL | Status: DC | PRN

## 2012-04-14 MED ORDER — GLUCOSE BLOOD VI STRP: ORAL_STRIP | Status: DC

## 2012-04-14 NOTE — Telephone Encounter (Signed)
Sent in as requested 

## 2012-04-19 ENCOUNTER — Telehealth: Payer: Self-pay | Admitting: Family Medicine

## 2012-04-19 NOTE — Telephone Encounter (Signed)
May I have a script for this.  Patient has medicare.

## 2012-04-20 NOTE — Telephone Encounter (Signed)
Script written

## 2012-04-20 NOTE — Telephone Encounter (Signed)
rx faxed

## 2012-05-11 ENCOUNTER — Ambulatory Visit (INDEPENDENT_AMBULATORY_CARE_PROVIDER_SITE_OTHER): Payer: Medicare Other | Admitting: Family Medicine

## 2012-05-11 ENCOUNTER — Encounter: Payer: Self-pay | Admitting: Family Medicine

## 2012-05-11 VITALS — BP 112/80 | HR 97 | Resp 15 | Ht 68.25 in | Wt 184.0 lb

## 2012-05-11 DIAGNOSIS — F3289 Other specified depressive episodes: Secondary | ICD-10-CM

## 2012-05-11 DIAGNOSIS — R05 Cough: Secondary | ICD-10-CM

## 2012-05-11 DIAGNOSIS — I10 Essential (primary) hypertension: Secondary | ICD-10-CM

## 2012-05-11 DIAGNOSIS — E039 Hypothyroidism, unspecified: Secondary | ICD-10-CM

## 2012-05-11 DIAGNOSIS — E1149 Type 2 diabetes mellitus with other diabetic neurological complication: Secondary | ICD-10-CM

## 2012-05-11 DIAGNOSIS — G4700 Insomnia, unspecified: Secondary | ICD-10-CM

## 2012-05-11 DIAGNOSIS — L84 Corns and callosities: Secondary | ICD-10-CM

## 2012-05-11 DIAGNOSIS — F329 Major depressive disorder, single episode, unspecified: Secondary | ICD-10-CM

## 2012-05-11 DIAGNOSIS — Z13820 Encounter for screening for osteoporosis: Secondary | ICD-10-CM

## 2012-05-11 DIAGNOSIS — R059 Cough, unspecified: Secondary | ICD-10-CM

## 2012-05-11 DIAGNOSIS — Z1239 Encounter for other screening for malignant neoplasm of breast: Secondary | ICD-10-CM

## 2012-05-11 MED ORDER — LEVOTHYROXINE SODIUM 50 MCG PO TABS: 50.0000 ug | ORAL_TABLET | Freq: Every day | ORAL | Status: DC

## 2012-05-11 MED ORDER — SERTRALINE HCL 50 MG PO TABS: 50.0000 mg | ORAL_TABLET | Freq: Every day | ORAL | Status: DC

## 2012-05-11 MED ORDER — AZITHROMYCIN 250 MG PO TABS: ORAL_TABLET | ORAL | Status: AC

## 2012-05-11 MED ORDER — TRAMADOL HCL 50 MG PO TABS: 50.0000 mg | ORAL_TABLET | Freq: Four times a day (QID) | ORAL | Status: DC | PRN

## 2012-05-11 MED ORDER — HYDROCHLOROTHIAZIDE 25 MG PO TABS: 25.0000 mg | ORAL_TABLET | Freq: Every day | ORAL | Status: DC

## 2012-05-11 MED ORDER — BENZONATATE 100 MG PO CAPS: 100.0000 mg | ORAL_CAPSULE | Freq: Three times a day (TID) | ORAL | Status: DC | PRN

## 2012-05-11 MED ORDER — TRAZODONE HCL 50 MG PO TABS: 50.0000 mg | ORAL_TABLET | Freq: Every day | ORAL | Status: DC

## 2012-05-11 MED ORDER — AMLODIPINE-OLMESARTAN 10-40 MG PO TABS: 1.0000 | ORAL_TABLET | Freq: Every day | ORAL | Status: DC

## 2012-05-11 NOTE — Patient Instructions (Addendum)
Try the trazodone for sleep  Stop the Clonazepam for now, unless you have an anxiety  Take antibiotics and cough medicine prescribed Mammogram and Bone Density to be set up  Try B12 vitamin  Referral to the foot doctor  F/U 8 weeks

## 2012-05-11 NOTE — Progress Notes (Signed)
  Subjective:    Patient ID: Natalie Long, female    DOB: May 03, 1936, 76 y.o.   MRN: [id number]  HPI    Patient here to follow chronic medical problems. She also has had a dry cough for the past one month which is not improving. She does not feel ill however did visit the nursing home and had positive  Sick contacts She also has concern about calluses on both feet which cause a lot of pain when she walks. Not sleeping well, has been on klonopin for many years, does not use every night, only uses once a day when she does  Review of Systems  GEN- denies fatigue, fever, weight loss,weakness, recent illness HEENT- denies eye drainage, change in vision, nasal discharge, CVS- denies chest pain, palpitations RESP- denies SOB,+ cough, wheeze ABD- denies N/V, change in stools, abd pain GU- denies dysuria, hematuria, dribbling, incontinence MSK- + joint pain, muscle aches, injury Neuro- denies headache, dizziness, syncope, seizure activity      Objective:   Physical Exam GEN- NAD, alert and oriented x3 HEENT- PERRL, EOMI, non injected sclera, pink conjunctiva, MMM, oropharynx clear Neck- Supple, no LAD CVS- RRR, no murmur RESP-CTAB, harsh cough EXT- No edema Pulses- Radial, DP- 2+ Psych-normal affect and mood  Diabetic foot exam      Assessment & Plan:

## 2012-05-12 DIAGNOSIS — R059 Cough, unspecified: Secondary | ICD-10-CM | POA: Insufficient documentation

## 2012-05-12 DIAGNOSIS — R05 Cough: Secondary | ICD-10-CM | POA: Insufficient documentation

## 2012-05-12 DIAGNOSIS — L84 Corns and callosities: Secondary | ICD-10-CM | POA: Insufficient documentation

## 2012-05-12 NOTE — Assessment & Plan Note (Signed)
Normal TSH, continue current dose

## 2012-05-12 NOTE — Assessment & Plan Note (Signed)
She is feeling much better on zoloft

## 2012-05-12 NOTE — Assessment & Plan Note (Signed)
Diet controlled.  

## 2012-05-12 NOTE — Assessment & Plan Note (Signed)
Will stop Klonopin for now, trial of trazodone

## 2012-05-12 NOTE — Assessment & Plan Note (Signed)
Refer to podiatry for treatment foot problems, high risk with diabetes

## 2012-05-12 NOTE — Assessment & Plan Note (Signed)
Will treat for URI/Bronchitis with zpak, cough medicine given If no improvement CXR

## 2012-05-12 NOTE — Assessment & Plan Note (Signed)
Well controlled, no change to meds 

## 2012-05-14 ENCOUNTER — Telehealth: Payer: Self-pay | Admitting: Family Medicine

## 2012-05-14 NOTE — Telephone Encounter (Signed)
Patient is aware 

## 2012-05-18 ENCOUNTER — Ambulatory Visit (HOSPITAL_COMMUNITY)
Admission: RE | Admit: 2012-05-18 | Discharge: 2012-05-18 | Disposition: A | Discharge status: Home / Self Care | Payer: Medicare Other | Source: Ambulatory Visit | Attending: Family Medicine | Admitting: Family Medicine

## 2012-05-18 ENCOUNTER — Other Ambulatory Visit (HOSPITAL_COMMUNITY): Payer: Self-pay | Admitting: Oncology

## 2012-05-18 ENCOUNTER — Ambulatory Visit (HOSPITAL_COMMUNITY): Payer: Medicare Other

## 2012-05-18 DIAGNOSIS — Z13820 Encounter for screening for osteoporosis: Secondary | ICD-10-CM

## 2012-05-18 DIAGNOSIS — M810 Age-related osteoporosis without current pathological fracture: Secondary | ICD-10-CM | POA: Insufficient documentation

## 2012-05-19 ENCOUNTER — Other Ambulatory Visit: Payer: Self-pay | Admitting: Family Medicine

## 2012-05-19 MED ORDER — ALENDRONATE SODIUM 70 MG PO TABS: 70.0000 mg | ORAL_TABLET | ORAL | Status: DC

## 2012-05-19 NOTE — Telephone Encounter (Signed)
You may refill #30 at bedtime, R 2

## 2012-05-19 NOTE — Telephone Encounter (Signed)
Patient states that trazodone is ineffective and she has restarted the Klonopin at bedtime as needed.  She is asking for a refill on the Klonopin.

## 2012-05-20 ENCOUNTER — Ambulatory Visit (HOSPITAL_COMMUNITY): Payer: Medicare Other

## 2012-05-21 ENCOUNTER — Other Ambulatory Visit: Payer: Self-pay

## 2012-05-21 MED ORDER — CLONAZEPAM 1 MG PO TABS: 1.0000 mg | ORAL_TABLET | Freq: Every evening | ORAL | Status: DC | PRN

## 2012-05-21 NOTE — Telephone Encounter (Signed)
Med refilled.

## 2012-05-31 ENCOUNTER — Ambulatory Visit (HOSPITAL_COMMUNITY)
Admission: RE | Admit: 2012-05-31 | Discharge: 2012-05-31 | Disposition: A | Discharge status: Home / Self Care | Payer: Medicare Other | Source: Ambulatory Visit | Attending: Family Medicine | Admitting: Family Medicine

## 2012-05-31 DIAGNOSIS — Z1231 Encounter for screening mammogram for malignant neoplasm of breast: Secondary | ICD-10-CM | POA: Insufficient documentation

## 2012-05-31 DIAGNOSIS — Z1239 Encounter for other screening for malignant neoplasm of breast: Secondary | ICD-10-CM

## 2012-06-16 ENCOUNTER — Other Ambulatory Visit (HOSPITAL_COMMUNITY): Payer: Self-pay | Admitting: Podiatry

## 2012-06-16 DIAGNOSIS — G629 Polyneuropathy, unspecified: Secondary | ICD-10-CM

## 2012-06-21 ENCOUNTER — Ambulatory Visit (HOSPITAL_COMMUNITY): Payer: Medicare Other

## 2012-07-13 ENCOUNTER — Encounter: Payer: Self-pay | Admitting: Family Medicine

## 2012-07-13 ENCOUNTER — Ambulatory Visit (INDEPENDENT_AMBULATORY_CARE_PROVIDER_SITE_OTHER): Payer: Medicare Other | Admitting: Family Medicine

## 2012-07-13 VITALS — BP 126/68 | HR 100 | Resp 18 | Ht 68.25 in | Wt 187.0 lb

## 2012-07-13 DIAGNOSIS — E119 Type 2 diabetes mellitus without complications: Secondary | ICD-10-CM

## 2012-07-13 DIAGNOSIS — M810 Age-related osteoporosis without current pathological fracture: Secondary | ICD-10-CM | POA: Insufficient documentation

## 2012-07-13 DIAGNOSIS — F411 Generalized anxiety disorder: Secondary | ICD-10-CM

## 2012-07-13 DIAGNOSIS — F3289 Other specified depressive episodes: Secondary | ICD-10-CM

## 2012-07-13 DIAGNOSIS — G4700 Insomnia, unspecified: Secondary | ICD-10-CM

## 2012-07-13 DIAGNOSIS — E1149 Type 2 diabetes mellitus with other diabetic neurological complication: Secondary | ICD-10-CM

## 2012-07-13 DIAGNOSIS — F329 Major depressive disorder, single episode, unspecified: Secondary | ICD-10-CM

## 2012-07-13 DIAGNOSIS — I10 Essential (primary) hypertension: Secondary | ICD-10-CM

## 2012-07-13 DIAGNOSIS — Z1211 Encounter for screening for malignant neoplasm of colon: Secondary | ICD-10-CM

## 2012-07-13 NOTE — Assessment & Plan Note (Signed)
Improved on zoloft, rare use of klonopin

## 2012-07-13 NOTE — Assessment & Plan Note (Signed)
She is aware not to take both klonopin and trazodone at bedtime

## 2012-07-13 NOTE — Patient Instructions (Addendum)
Stop the fosamax Get the labs drawn today Referral for colonoscopy One touch ultra test strips  Do not take klonopin and trazodone at the same time F/U 3 months

## 2012-07-13 NOTE — Assessment & Plan Note (Addendum)
She is not able to tolerate fosamax, will increase calcium to BID 1200mg - 800IU for now

## 2012-07-13 NOTE — Assessment & Plan Note (Signed)
Continues to do well on zoloft

## 2012-07-13 NOTE — Assessment & Plan Note (Addendum)
Diet controlled, recheck A1C today, to make sure she is maintaining, continue to check Fasting CBG

## 2012-07-13 NOTE — Assessment & Plan Note (Signed)
Well controlled 

## 2012-07-13 NOTE — Progress Notes (Signed)
  Subjective:    Patient ID: Natalie Long, female    DOB: [birth year], 77 y.o.   MRN: [id number]  HPI  Pt here to f/u chronic medical problems DM- fasting CBG 100, no meds Depession -doing well, wanted to review medications, uses trazodone to help with sleep, does not use klonopin very much still has bottle from [facility] with her , she is due for colonoscopy and wants to have this done Started on fosamax , this caused her to feel "funny", headache, dizziness, Nausea therefore she stopped, took 4 doses as directed Review of Systems   GEN- denies fatigue, fever, weight loss,weakness, recent illness HEENT- denies eye drainage, change in vision, nasal discharge, CVS- denies chest pain, palpitations RESP- denies SOB, cough, wheeze ABD- denies N/V, change in stools, abd pain GU- denies dysuria, hematuria, dribbling, incontinence MSK- denies joint pain, muscle aches, injury Neuro- denies headache, dizziness, syncope, seizure activity      Objective:   Physical Exam GEN- NAD, alert and oriented x3 HEENT- PERRL, EOMI, non injected sclera, pink conjunctiva, MMM, oropharynx clear Neck- Supple, CVS- RRR, no murmur RESP-CTAB ABD-NABS,soft,NT,ND EXT- No edema, callus bilateral soles Pulses- Radial, DP- 2+        Assessment & Plan:

## 2012-07-14 LAB — BASIC METABOLIC PANEL
BUN: 18 mg/dL (ref 6–23)
Creat: 0.79 mg/dL (ref 0.50–1.10)
Glucose, Bld: 92 mg/dL (ref 70–99)
Potassium: 3.6 mEq/L (ref 3.5–5.3)

## 2012-07-14 LAB — HEMOGLOBIN A1C: Hgb A1c MFr Bld: 6.8 % — ABNORMAL HIGH (ref <5.7)

## 2012-07-21 ENCOUNTER — Telehealth: Payer: Self-pay

## 2012-07-21 NOTE — Telephone Encounter (Signed)
Pt was referred by Dr. Jeanice Lim for screening colonoscopy. Having some constipation. Ov with Lorenza Burton, NP on 08/09/2012 at 2:00 PM.

## 2012-07-24 DIAGNOSIS — IMO0001 Reserved for inherently not codable concepts without codable children: Secondary | ICD-10-CM | POA: Insufficient documentation

## 2012-08-05 ENCOUNTER — Encounter: Payer: Self-pay | Admitting: Internal Medicine

## 2012-08-09 ENCOUNTER — Ambulatory Visit (INDEPENDENT_AMBULATORY_CARE_PROVIDER_SITE_OTHER): Payer: Medicare Other | Admitting: Urgent Care

## 2012-08-09 ENCOUNTER — Encounter: Payer: Self-pay | Admitting: Urgent Care

## 2012-08-09 VITALS — BP 125/69 | HR 114 | Temp 97.6°F | Ht 71.0 in | Wt 185.0 lb

## 2012-08-09 DIAGNOSIS — K5900 Constipation, unspecified: Secondary | ICD-10-CM

## 2012-08-09 DIAGNOSIS — K5909 Other constipation: Secondary | ICD-10-CM

## 2012-08-09 MED ORDER — PEG 3350-KCL-NA BICARB-NACL 420 G PO SOLR: 4000.0000 mL | ORAL | Status: DC

## 2012-08-09 NOTE — Patient Instructions (Addendum)
Miralax 17 grams daily as needed for constipation Colonoscopy with Dr Jena Gauss Constipation, Adult Constipation is when a person has fewer than 3 bowel movements a week; has difficulty having a bowel movement; or has stools that are dry, hard, or larger than normal. As people grow older, constipation is more common. If you try to fix constipation with medicines that make you have a bowel movement (laxatives), the problem may get worse. Long-term laxative use may cause the muscles of the colon to become weak. A low-fiber diet, not taking in enough fluids, and taking certain medicines may make constipation worse. CAUSES   Certain medicines, such as antidepressants, pain medicine, iron supplements, antacids, and water pills.   Certain diseases, such as diabetes, irritable bowel syndrome (IBS), thyroid disease, or depression.   Not drinking enough water.   Not eating enough fiber-rich foods.   Stress or travel.  Lack of physical activity or exercise.  Not going to the restroom when there is the urge to have a bowel movement.  Ignoring the urge to have a bowel movement.  Using laxatives too much. SYMPTOMS   Having fewer than 3 bowel movements a week.   Straining to have a bowel movement.   Having hard, dry, or larger than normal stools.   Feeling full or bloated.   Pain in the lower abdomen.  Not feeling relief after having a bowel movement. DIAGNOSIS  Your caregiver will take a medical history and perform a physical exam. Further testing may be done for severe constipation. Some tests may include:   A barium enema X-ray to examine your rectum, colon, and sometimes, your small intestine.  A sigmoidoscopy to examine your lower colon.  A colonoscopy to examine your entire colon. TREATMENT  Treatment will depend on the severity of your constipation and what is causing it. Some dietary treatments include drinking more fluids and eating more fiber-rich foods. Lifestyle  treatments may include regular exercise. If these diet and lifestyle recommendations do not help, your caregiver may recommend taking over-the-counter laxative medicines to help you have bowel movements. Prescription medicines may be prescribed if over-the-counter medicines do not work.  HOME CARE INSTRUCTIONS   Increase dietary fiber in your diet, such as fruits, vegetables, whole grains, and beans. Limit high-fat and processed sugars in your diet, such as [place] fries, hamburgers, cookies, candies, and soda.   A fiber supplement may be added to your diet if you cannot get enough fiber from foods.   Drink enough fluids to keep your urine clear or pale yellow.   Exercise regularly or as directed by your caregiver.   Go to the restroom when you have the urge to go. Do not hold it.  Only take medicines as directed by your caregiver. Do not take other medicines for constipation without talking to your caregiver first. SEEK IMMEDIATE MEDICAL CARE IF:   You have bright red blood in your stool.   Your constipation lasts for more than 4 days or gets worse.   You have abdominal or rectal pain.   You have thin, pencil-like stools.  You have unexplained weight loss. MAKE SURE YOU:   Understand these instructions.  Will watch your condition.  Will get help right away if you are not doing well or get worse. Document Released: 02/15/2004 Document Revised: 08/11/2011 Document Reviewed: 04/22/2011 [facility] Patient Information 2013 [place], [place].

## 2012-08-09 NOTE — Progress Notes (Signed)
Referring Natalie Long: Natalie Scarlet, MD Primary Care Physician:  Natalie Antis, MD Primary Gastroenterologist:  Dr. Jena Long  Chief Complaint  Patient presents with  . Colonoscopy    HPI:  Natalie Long is a 77 y.o. female here as a referral from Dr. Jeanice Lim for colonoscopy.  Upon triage, she noted problems with constipation so she was asked to come in for an office visit.  She has hx chronic constipation.  She occasionally takes OTC suppositories, not sure of type.  She took Dulcolax the other night & it helps some.  Correctol PRN helps a little as well.  She has never tried Miralax.  She goes 3-4 days between BM.  She has been increasing her roughage & eating greens.  She has hx cystocele & rectocele.   Last colonoscopy normal by Dr Natalie Long in 2004.  Hx GERD, not on PPI,doing well.  Past Medical History  Diagnosis Date  . Hypertension   . Diabetes mellitus without complication   . Depression   . Thyroid disease   . Arthritis   . Back pain   . Anxiety   . Hyperlipidemia   . Rectocele   . Cystocele   . Constipation     Past Surgical History  Procedure Laterality Date  . Abdominal hysterectomy    . Cataract extraction  2004 and 2008  . Esophagogastroduodenoscopy  01/01/2009    [id number] hiatal hernia.Normal esophagus.Antral nodule, status post biopsy  . Colonoscopy  2004    Dr Natalie Long    Current Outpatient Prescriptions  Medication Sig Dispense Refill  . amLODipine-olmesartan (AZOR) 10-40 MG per tablet Take 1 tablet by mouth daily.  90 tablet  1  . aspirin 81 MG tablet Take 81 mg by mouth daily.      . Calcium Carbonate-Vitamin D (CALCIUM 600+D) 600-400 MG-UNIT per tablet Take 2 tablets by mouth daily.      . hydrochlorothiazide (HYDRODIURIL) 25 MG tablet Take 1 tablet (25 mg total) by mouth daily.  90 tablet  1  . levothyroxine (SYNTHROID, LEVOTHROID) 50 MCG tablet Take 1 tablet (50 mcg total) by mouth daily.  90 tablet  1  . sertraline (ZOLOFT) 50 MG tablet  Take 1 tablet (50 mg total) by mouth daily.  90 tablet  1  . clonazePAM (KLONOPIN) 1 MG tablet Take 1 mg by mouth at bedtime as needed for anxiety.      . polyethylene glycol-electrolytes (TRILYTE) 420 G solution Take 4,000 mLs by mouth as directed.  4000 mL  0  . traMADol (ULTRAM) 50 MG tablet Take 50 mg by mouth every 6 (six) hours as needed for pain.      . traZODone (DESYREL) 50 MG tablet Take 50 mg by mouth at bedtime as needed for sleep. For sleep       No current facility-administered medications for this visit.    Allergies as of 08/09/2012 - Review Complete 08/09/2012  Allergen Reaction Noted  . Codeine  07/13/2007    Family History  Problem Relation Age of Onset  . Osteoporosis Mother   . Heart disease Father   . Hyperlipidemia Father   . Hypertension Father   . Depression Sister   . Cancer Sister     metastatic  . Diabetes Sister     History   Social History  . Marital Status: Widowed    Spouse Name: N/A    Number of Children: N/A  . Years of Education: N/A   Occupational History  . Not on file.  Social History Main Topics  . Smoking status: Never Smoker   . Smokeless tobacco: Not on file  . Alcohol Use: No  . Drug Use: No  . Sexually Active: Not on file   Other Topics Concern  . Not on file   Social History Narrative  . No narrative on file    Review of Systems: Gen: Denies any fever, chills, sweats, anorexia, fatigue, weakness, malaise, weight loss, and sleep disorder CV: Denies chest pain, angina, palpitations, syncope, orthopnea, PND, peripheral edema, and claudication. Resp: Denies dyspnea at rest, dyspnea with exercise, cough, sputum, wheezing, coughing up blood, and pleurisy. GI: Denies vomiting blood, jaundice, and fecal incontinence.   Denies dysphagia or odynophagia. GU : Denies urinary burning, blood in urine, urinary frequency, urinary hesitancy, nocturnal urination, and urinary incontinence. MS: Denies joint pain, limitation of  movement, and swelling, stiffness, low back pain, extremity pain. Denies muscle weakness, cramps, atrophy.  Derm: Denies rash, itching, dry skin, hives, moles, warts, or unhealing ulcers.  Psych: Denies depression, anxiety, memory loss, suicidal ideation, hallucinations, paranoia, and confusion. Heme: Denies bruising, bleeding, and enlarged lymph nodes. Neuro:  Denies any headaches, dizziness, paresthesias. Endo:  Denies any problems with DM, thyroid, adrenal function.  Physical Exam: BP 125/69  Pulse 114  Temp(Src) 97.6 F (36.4 C) (Oral)  Ht 5\' 11"  (1.803 m)  Wt 185 lb (83.915 kg)  BMI 25.81 kg/m2 No LMP recorded. Patient has had a hysterectomy. General:   Alert,  Well-developed, well-nourished, pleasant and cooperative in NAD Head:  Normocephalic and atraumatic. Eyes:  Sclera clear, no icterus.   Conjunctiva pink. Ears:  Normal auditory acuity. Nose:  No deformity, discharge, or lesions. Mouth:  No deformity or lesions,oropharynx pink & moist. Neck:  Supple; no masses or thyromegaly. Lungs:  Clear throughout to auscultation.   No wheezes, crackles, or rhonchi. No acute distress. Heart:  Regular rate and rhythm; no murmurs, clicks, rubs,  or gallops. Abdomen:  Normal bowel sounds.  No bruits.  Soft, non-tender and non-distended without masses, hepatosplenomegaly or hernias noted.  No guarding or rebound tenderness.   Rectal:  Deferred.  Msk:  Symmetrical without gross deformities. Normal posture. Pulses:  Normal pulses noted. Extremities:  No edema. Neurologic:  Alert and oriented x4;  grossly normal neurologically. Skin:  Intact without significant lesions or rashes. Lymph Nodes:  No significant cervical adenopathy. Psych:  Alert and cooperative. Normal mood and affect.

## 2012-08-10 ENCOUNTER — Encounter (HOSPITAL_COMMUNITY): Payer: Self-pay | Admitting: Pharmacy Technician

## 2012-08-10 ENCOUNTER — Encounter: Payer: Self-pay | Admitting: Urgent Care

## 2012-08-10 NOTE — Assessment & Plan Note (Signed)
Natalie Long is a pleasant 77 y.o. female with chronic constipation with minimal response to OTC stool softeners & laxatives.  Due for screening colonoscopy with Dr Jena Gauss.  I have discussed risks & benefits which include, but are not limited to, bleeding, infection, perforation & drug reaction.  The patient agrees with this plan & written consent will be obtained.     Add Miralax 17 grams daily as needed for constipation Constipation literature

## 2012-08-11 NOTE — Progress Notes (Signed)
Faxed to PCP

## 2012-08-12 ENCOUNTER — Ambulatory Visit (HOSPITAL_COMMUNITY)
Admission: RE | Admit: 2012-08-12 | Discharge: 2012-08-12 | Disposition: A | Discharge status: Home / Self Care | Payer: Medicare Other | Source: Ambulatory Visit | Attending: Internal Medicine | Admitting: Internal Medicine

## 2012-08-12 ENCOUNTER — Encounter (HOSPITAL_COMMUNITY)
Admission: RE | Disposition: A | Discharge status: Home / Self Care | Payer: Self-pay | Source: Ambulatory Visit | Attending: Internal Medicine

## 2012-08-12 ENCOUNTER — Encounter (HOSPITAL_COMMUNITY): Payer: Self-pay | Admitting: *Deleted

## 2012-08-12 DIAGNOSIS — K5730 Diverticulosis of large intestine without perforation or abscess without bleeding: Secondary | ICD-10-CM

## 2012-08-12 DIAGNOSIS — I10 Essential (primary) hypertension: Secondary | ICD-10-CM | POA: Insufficient documentation

## 2012-08-12 DIAGNOSIS — K5909 Other constipation: Secondary | ICD-10-CM

## 2012-08-12 DIAGNOSIS — E119 Type 2 diabetes mellitus without complications: Secondary | ICD-10-CM | POA: Insufficient documentation

## 2012-08-12 DIAGNOSIS — Z1211 Encounter for screening for malignant neoplasm of colon: Secondary | ICD-10-CM

## 2012-08-12 DIAGNOSIS — Z01812 Encounter for preprocedural laboratory examination: Secondary | ICD-10-CM | POA: Insufficient documentation

## 2012-08-12 HISTORY — PX: COLONOSCOPY: SHX5424

## 2012-08-12 LAB — GLUCOSE, CAPILLARY
Glucose-Capillary: 125 mg/dL — ABNORMAL HIGH (ref 70–99)
Glucose-Capillary: 95 mg/dL (ref 70–99)

## 2012-08-12 SURGERY — COLONOSCOPY
Anesthesia: Moderate Sedation

## 2012-08-12 MED ORDER — SODIUM CHLORIDE 0.45 % IV SOLN
INTRAVENOUS | Status: DC
  Administered 2012-08-12: 1000 mL via INTRAVENOUS

## 2012-08-12 MED ORDER — MEPERIDINE HCL 100 MG/ML IJ SOLN
INTRAMUSCULAR | Status: AC
  Filled 2012-08-12: qty 2

## 2012-08-12 MED ORDER — ONDANSETRON HCL 4 MG/2ML IJ SOLN
INTRAMUSCULAR | Status: DC | PRN
  Administered 2012-08-12: 4 mg via INTRAVENOUS

## 2012-08-12 MED ORDER — STERILE WATER FOR IRRIGATION IR SOLN
Status: DC | PRN
  Administered 2012-08-12: 13:00:00

## 2012-08-12 MED ORDER — ONDANSETRON HCL 4 MG/2ML IJ SOLN
INTRAMUSCULAR | Status: AC
  Filled 2012-08-12: qty 2

## 2012-08-12 MED ORDER — MEPERIDINE HCL 100 MG/ML IJ SOLN
INTRAMUSCULAR | Status: DC | PRN
  Administered 2012-08-12: 25 mg via INTRAVENOUS
  Administered 2012-08-12 (×2): 50 mg

## 2012-08-12 MED ORDER — MIDAZOLAM HCL 5 MG/5ML IJ SOLN
INTRAMUSCULAR | Status: AC
  Filled 2012-08-12: qty 10

## 2012-08-12 MED ORDER — MIDAZOLAM HCL 5 MG/5ML IJ SOLN
INTRAMUSCULAR | Status: DC | PRN
  Administered 2012-08-12: 1 mg via INTRAVENOUS
  Administered 2012-08-12 (×3): 2 mg via INTRAVENOUS

## 2012-08-12 NOTE — Interval H&P Note (Signed)
History and Physical Interval Note:  08/12/2012 1:08 PM  Natalie Long  has presented today for surgery, with the diagnosis of CONSTIPATION  The various methods of treatment have been discussed with the patient and family. After consideration of risks, benefits and other options for treatment, the patient has consented to  Procedure(s) with comments: COLONOSCOPY (N/A) - 11:30 as a surgical intervention .  The patient's history has been reviewed, patient examined, no change in status, stable for surgery.  I have reviewed the patient's chart and labs.  Questions were answered to the patient's satisfaction.    Screening colonoscopy per plan.The risks, benefits, limitations, alternatives and imponderables have been reviewed with the patient. Questions have been answered. All parties are agreeable.    Eula Listen

## 2012-08-12 NOTE — H&P (View-Only) (Signed)
Referring Provider: Nokomis, Kawanta F, MD Primary Care Physician:  Chisholm, KAWANTA, MD Primary Gastroenterologist:  Dr. Rourk  Chief Complaint  Patient presents with  . Colonoscopy    HPI:  Natalie Long is a 77 y.o. female here as a referral from Dr. [place] for colonoscopy.  Upon triage, she noted problems with constipation so she was asked to come in for an office visit.  She has hx chronic constipation.  She occasionally takes OTC suppositories, not sure of type.  She took Dulcolax the other night & it helps some.  Correctol PRN helps a little as well.  She has never tried Miralax.  She goes 3-4 days between BM.  She has been increasing her roughage & eating greens.  She has hx cystocele & rectocele.   Last colonoscopy normal by Dr Rehman in 2004.  Hx GERD, not on PPI,doing well.  Past Medical History  Diagnosis Date  . Hypertension   . Diabetes mellitus without complication   . Depression   . Thyroid disease   . Arthritis   . Back pain   . Anxiety   . Hyperlipidemia   . Rectocele   . Cystocele   . Constipation     Past Surgical History  Procedure Laterality Date  . Abdominal hysterectomy    . Cataract extraction  2004 and 2008  . Esophagogastroduodenoscopy  01/01/2009    RMR:Moderate-sized hiatal hernia.Normal esophagus.Antral nodule, status post biopsy  . Colonoscopy  2004    Dr Rehman-normal    Current Outpatient Prescriptions  Medication Sig Dispense Refill  . amLODipine-olmesartan (AZOR) 10-40 MG per tablet Take 1 tablet by mouth daily.  90 tablet  1  . aspirin 81 MG tablet Take 81 mg by mouth daily.      . Calcium Carbonate-Vitamin D (CALCIUM 600+D) 600-400 MG-UNIT per tablet Take 2 tablets by mouth daily.      . hydrochlorothiazide (HYDRODIURIL) 25 MG tablet Take 1 tablet (25 mg total) by mouth daily.  90 tablet  1  . levothyroxine (SYNTHROID, LEVOTHROID) 50 MCG tablet Take 1 tablet (50 mcg total) by mouth daily.  90 tablet  1  . sertraline (ZOLOFT) 50 MG tablet  Take 1 tablet (50 mg total) by mouth daily.  90 tablet  1  . clonazePAM (KLONOPIN) 1 MG tablet Take 1 mg by mouth at bedtime as needed for anxiety.      . polyethylene glycol-electrolytes (TRILYTE) 420 G solution Take 4,000 mLs by mouth as directed.  4000 mL  0  . traMADol (ULTRAM) 50 MG tablet Take 50 mg by mouth every 6 (six) hours as needed for pain.      . traZODone (DESYREL) 50 MG tablet Take 50 mg by mouth at bedtime as needed for sleep. For sleep       No current facility-administered medications for this visit.    Allergies as of 08/09/2012 - Review Complete 08/09/2012  Allergen Reaction Noted  . Codeine  07/13/2007    Family History  Problem Relation Age of Onset  . Osteoporosis Mother   . Heart disease Father   . Hyperlipidemia Father   . Hypertension Father   . Depression Sister   . Cancer Sister     metastatic  . Diabetes Sister     History   Social History  . Marital Status: Widowed    Spouse Name: N/A    Number of Children: N/A  . Years of Education: N/A   Occupational History  . Not on file.     Social History Main Topics  . Smoking status: Never Smoker   . Smokeless tobacco: Not on file  . Alcohol Use: No  . Drug Use: No  . Sexually Active: Not on file   Other Topics Concern  . Not on file   Social History Narrative  . No narrative on file    Review of Systems: Gen: Denies any fever, chills, sweats, anorexia, fatigue, weakness, malaise, weight loss, and sleep disorder CV: Denies chest pain, angina, palpitations, syncope, orthopnea, PND, peripheral edema, and claudication. Resp: Denies dyspnea at rest, dyspnea with exercise, cough, sputum, wheezing, coughing up blood, and pleurisy. GI: Denies vomiting blood, jaundice, and fecal incontinence.   Denies dysphagia or odynophagia. GU : Denies urinary burning, blood in urine, urinary frequency, urinary hesitancy, nocturnal urination, and urinary incontinence. MS: Denies joint pain, limitation of  movement, and swelling, stiffness, low back pain, extremity pain. Denies muscle weakness, cramps, atrophy.  Derm: Denies rash, itching, dry skin, hives, moles, warts, or unhealing ulcers.  Psych: Denies depression, anxiety, memory loss, suicidal ideation, hallucinations, paranoia, and confusion. Heme: Denies bruising, bleeding, and enlarged lymph nodes. Neuro:  Denies any headaches, dizziness, paresthesias. Endo:  Denies any problems with DM, thyroid, adrenal function.  Physical Exam: BP 125/69  Pulse 114  Temp(Src) 97.6 F (36.4 C) (Oral)  Ht 5' 11" (1.803 m)  Wt 185 lb (83.915 kg)  BMI 25.81 kg/m2 No LMP recorded. Patient has had a hysterectomy. General:   Alert,  Well-developed, well-nourished, pleasant and cooperative in NAD Head:  Normocephalic and atraumatic. Eyes:  Sclera clear, no icterus.   Conjunctiva pink. Ears:  Normal auditory acuity. Nose:  No deformity, discharge, or lesions. Mouth:  No deformity or lesions,oropharynx pink & moist. Neck:  Supple; no masses or thyromegaly. Lungs:  Clear throughout to auscultation.   No wheezes, crackles, or rhonchi. No acute distress. Heart:  Regular rate and rhythm; no murmurs, clicks, rubs,  or gallops. Abdomen:  Normal bowel sounds.  No bruits.  Soft, non-tender and non-distended without masses, hepatosplenomegaly or hernias noted.  No guarding or rebound tenderness.   Rectal:  Deferred.  Msk:  Symmetrical without gross deformities. Normal posture. Pulses:  Normal pulses noted. Extremities:  No edema. Neurologic:  Alert and oriented x4;  grossly normal neurologically. Skin:  Intact without significant lesions or rashes. Lymph Nodes:  No significant cervical adenopathy. Psych:  Alert and cooperative. Normal mood and affect.   

## 2012-08-12 NOTE — Op Note (Signed)
[facility] [address] [place], [address]   COLONOSCOPY PROCEDURE REPORT  PATIENT: Natalie Long, Natalie Long  MR#:         [id number] BIRTHDATE: 12/19/1935 , 76  yrs. old GENDER: Female ENDOSCOPIST: R.  Roetta Sessions, MD FACP FACG REFERRED BY:  Milinda Antis, M.D. PROCEDURE DATE:  08/12/2012 PROCEDURE:     Screening ileocolonoscopy  INDICATIONS: Average risk colorectal cancer screening. Intermittent constipation  INFORMED CONSENT:  The risks, benefits, alternatives and imponderables including but not limited to bleeding, perforation as well as the possibility of a missed lesion have been reviewed.  The potential for biopsy, lesion removal, etc. have also been discussed.  Questions have been answered.  All parties agreeable. Please see the history and physical in the medical record for more information.  MEDICATIONS: Versed 7 mg IV and Demerol 125 mg IV. Zofran 4 mg IV  DESCRIPTION OF PROCEDURE:  After a digital rectal exam was performed, the Pentax Colonoscope [phone]  colonoscope was advanced from the anus through the rectum and colon to the area of the cecum, ileocecal valve and appendiceal orifice.  The cecum was deeply intubated.  These structures were well-seen and photographed for the record.  From the level of the cecum and ileocecal valve, the scope was slowly and cautiously withdrawn.  The mucosal surfaces were carefully surveyed utilizing scope tip deflection to facilitate fold flattening as needed.  The scope was pulled down into the rectum where a thorough examination including retroflexion was performed.    FINDINGS:  Adequate preparation. Minimal; otherwise, normal rectum. Scattered pan-colonic diverticula; the remainder of the colonic mucosa appeared normal.  THERAPEUTIC / DIAGNOSTIC MANEUVERS PERFORMED:  none  COMPLICATIONS: None  CECAL WITHDRAWAL TIME:  9 minutes  IMPRESSION:  Colonic diverticulosis  RECOMMENDATIONS: Daily Benefiber.  MiraLax daily when  necessary for constipation. Consider one more screening colonoscopy in 10 years only if overall health permits.   _______________________________ eSigned:  R. Roetta Sessions, MD FACP [facility] 08/12/2012 1:58 PM   CC:    PATIENT NAME:  Cortlyn, Cannell MR#: [id number]

## 2012-08-18 ENCOUNTER — Encounter (HOSPITAL_COMMUNITY): Payer: Self-pay | Admitting: Internal Medicine

## 2012-08-27 ENCOUNTER — Ambulatory Visit: Payer: Medicare Other | Admitting: Family Medicine

## 2012-10-05 ENCOUNTER — Ambulatory Visit (INDEPENDENT_AMBULATORY_CARE_PROVIDER_SITE_OTHER): Payer: Medicare Other | Admitting: Family Medicine

## 2012-10-05 VITALS — BP 106/64 | HR 100 | Resp 18 | Ht 68.25 in | Wt 187.0 lb

## 2012-10-05 DIAGNOSIS — I10 Essential (primary) hypertension: Secondary | ICD-10-CM

## 2012-10-05 DIAGNOSIS — E1149 Type 2 diabetes mellitus with other diabetic neurological complication: Secondary | ICD-10-CM

## 2012-10-05 DIAGNOSIS — I959 Hypotension, unspecified: Secondary | ICD-10-CM

## 2012-10-05 DIAGNOSIS — F3289 Other specified depressive episodes: Secondary | ICD-10-CM

## 2012-10-05 DIAGNOSIS — R350 Frequency of micturition: Secondary | ICD-10-CM

## 2012-10-05 DIAGNOSIS — N390 Urinary tract infection, site not specified: Secondary | ICD-10-CM

## 2012-10-05 DIAGNOSIS — F329 Major depressive disorder, single episode, unspecified: Secondary | ICD-10-CM

## 2012-10-05 LAB — POCT URINALYSIS DIPSTICK
Glucose, UA: NEGATIVE
Ketones, UA: NEGATIVE
Spec Grav, UA: >=1.03

## 2012-10-05 LAB — CBC WITH DIFFERENTIAL/PLATELET
Eosinophils Relative: 4 % (ref 0–5)
HCT: 40.2 % (ref 36.0–46.0)
Hemoglobin: 12.8 g/dL (ref 12.0–15.0)
Lymphocytes Relative: 31 % (ref 12–46)
Lymphs Abs: 2.4 10*3/uL (ref 0.7–4.0)
MCV: 78.4 fL (ref 78.0–100.0)
Monocytes Relative: 6 % (ref 3–12)
Platelets: 226 10*3/uL (ref 150–400)
RBC: 5.13 MIL/uL — ABNORMAL HIGH (ref 3.87–5.11)
WBC: 8 10*3/uL (ref 4.0–10.5)

## 2012-10-05 LAB — BASIC METABOLIC PANEL
CO2: 32 mEq/L (ref 19–32)
Chloride: 99 mEq/L (ref 96–112)
Creat: 0.87 mg/dL (ref 0.50–1.10)
Sodium: 140 mEq/L (ref 135–145)

## 2012-10-05 MED ORDER — CEPHALEXIN 500 MG PO CAPS: 500.0000 mg | ORAL_CAPSULE | Freq: Two times a day (BID) | ORAL | Status: AC

## 2012-10-05 MED ORDER — SERTRALINE HCL 50 MG PO TABS: 50.0000 mg | ORAL_TABLET | Freq: Every day | ORAL | Status: DC

## 2012-10-05 NOTE — Patient Instructions (Signed)
Restart the zoloft Medications to be refilled Take antibiotics for the  Urine infection Get the labs done today  Take 1/2 tablet of Azor Drink cranberry juice F/U 2 weeks - For Recheck

## 2012-10-06 ENCOUNTER — Encounter: Payer: Self-pay | Admitting: Family Medicine

## 2012-10-06 MED ORDER — HYDROCHLOROTHIAZIDE 25 MG PO TABS: 25.0000 mg | ORAL_TABLET | Freq: Every day | ORAL | Status: AC

## 2012-10-06 NOTE — Progress Notes (Signed)
  Subjective:    Patient ID: Natalie Long, female    DOB: 1935-09-09, 77 y.o.   MRN: [id number]  HPI Pt presents with fatigue and not feeling well for past few days, Her fasting blood sugars have been in 140's. +urinary frequency and dysuria.Occasional dizziness, no HA. Medications reviewed. Has not been taking zoloft, thought she was suppose to stop. Continues to take benzo. She is quite upset and sad that I am moving to [place] [employer] to practice, she has fear of driving on expressway.   Review of Systems - per above  GEN- + fatigue, fever, weight loss,weakness, recent illness HEENT- denies eye drainage, change in vision, nasal discharge, CVS- denies chest pain, palpitations RESP- denies SOB, cough, wheeze ABD- denies N/V, change in stools, abd pain GU- + dysuria, hematuria, dribbling, incontinence MSK- + joint pain, muscle aches, injury Neuro- denies headache, dizziness, syncope, seizure activity      Objective:   Physical Exam GEN- NAD, alert and oriented x3, afebrile, not ill appearing HEENT- PERRL, EOMI, non injected sclera, pink conjunctiva, MMM, oropharynx clear Neck- Supple, no LAD CVS- RRR, no murmur RESP-CTAB ABD-NABS,soft, mild suprapubic tenderness, no rebound, no guarding EXT- No edema Pulses- Radial 2+ Psych- sad today, not overly anxious, good eye contact and train of thought       Assessment & Plan:

## 2012-10-06 NOTE — Assessment & Plan Note (Signed)
BP on lower side , will decrease her azor, recheck in 2 weeks, continue HCTZ

## 2012-10-06 NOTE — Assessment & Plan Note (Signed)
Restart zoloft, discussed importance of medication, she does better on this

## 2012-10-06 NOTE — Assessment & Plan Note (Signed)
Diet controlled CBG in office 160 after meal Check labs today BMET, CBC

## 2012-10-06 NOTE — Assessment & Plan Note (Signed)
UTI, antibiotics, cranberry juice

## 2012-10-07 LAB — MICROALBUMIN / CREATININE URINE RATIO
Creatinine, Urine: 168.7 mg/dL
Microalb, Ur: 1.26 mg/dL (ref 0.00–1.89)

## 2012-10-14 ENCOUNTER — Ambulatory Visit: Payer: Medicare Other | Admitting: Family Medicine

## 2012-10-19 ENCOUNTER — Ambulatory Visit (INDEPENDENT_AMBULATORY_CARE_PROVIDER_SITE_OTHER): Payer: Medicare Other | Admitting: Family Medicine

## 2012-10-19 ENCOUNTER — Encounter: Payer: Self-pay | Admitting: Family Medicine

## 2012-10-19 VITALS — BP 134/78 | HR 100 | Resp 18 | Ht 68.25 in | Wt 185.0 lb

## 2012-10-19 DIAGNOSIS — G4700 Insomnia, unspecified: Secondary | ICD-10-CM

## 2012-10-19 DIAGNOSIS — E039 Hypothyroidism, unspecified: Secondary | ICD-10-CM

## 2012-10-19 DIAGNOSIS — I10 Essential (primary) hypertension: Secondary | ICD-10-CM

## 2012-10-19 DIAGNOSIS — F3289 Other specified depressive episodes: Secondary | ICD-10-CM

## 2012-10-19 DIAGNOSIS — F329 Major depressive disorder, single episode, unspecified: Secondary | ICD-10-CM

## 2012-10-19 DIAGNOSIS — F411 Generalized anxiety disorder: Secondary | ICD-10-CM

## 2012-10-19 MED ORDER — BUSPIRONE HCL 7.5 MG PO TABS: 7.5000 mg | ORAL_TABLET | Freq: Two times a day (BID) | ORAL | Status: DC

## 2012-10-19 NOTE — Assessment & Plan Note (Signed)
She declines use of any anti-depressant, took zoloft for only 6 days this time, also did not tolerate trazodone

## 2012-10-19 NOTE — Assessment & Plan Note (Signed)
BP much improved with lower dose of Azor She has paid for 30 day supply, which should last 60 days as taking 1/2 tablet Consider changing to just norvasc, to help with cost

## 2012-10-19 NOTE — Assessment & Plan Note (Signed)
Continue low dose benzo at bedtime Did not tolerate trazodone

## 2012-10-19 NOTE — Progress Notes (Signed)
  Subjective:    Patient ID: Natalie Long, female    DOB: 07/04/1935, 77 y.o.   MRN: [id number]  HPI  Patient here for followup visit interim. Her last visit she was treated for urinary tract infection her blood pressure medication a sore was also decreased to half a tablet daily. She's done well with a half a tablet daily and has not had any dizziness. She still gets an anxious and overwhelmed sick feeling that she gets a day. She took the Zoloft for about 6 days however she does not want to be on antidepressant stopped taking his medication. She states that she feels stressed and nervous all the time and wants something to calm her down. She will be switching to a new PCP Dr. Catalina Pizza upon my departure. Labs were reviewed  Review of Systems  GEN- denies fatigue, fever, weight loss,weakness, recent illness HEENT- denies eye drainage, change in vision, nasal discharge, CVS- denies chest pain, palpitations RESP- denies SOB, cough, wheeze Neuro- denies headache, dizziness, syncope, seizure activity      Objective:   Physical Exam  GEN- NAD, alert and oriented x3, afebrile, not ill appearing CVS- RRR, no murmur RESP-CTAB EXT- No edema Pulses- Radial 2+ Psych- Anxious appearing, not depressed , good eye contact      Assessment & Plan:

## 2012-10-19 NOTE — Patient Instructions (Addendum)
Continue taking 1/2 tablet of AZOR Blood pressure looks good Start buspar twice a day for stress/nerves Test strips called in F/U with your new doctor Dr. Catalina Pizza

## 2012-10-19 NOTE — Assessment & Plan Note (Signed)
TSH normal 6 months ago

## 2012-10-19 NOTE — Assessment & Plan Note (Signed)
She has high anxiety, will try her on low dose Buspar, would be better off on SSRI but she declines use of these Has close f/u with new PCP

## 2012-11-16 ENCOUNTER — Telehealth: Payer: Self-pay | Admitting: Family Medicine

## 2012-11-16 MED ORDER — AMLODIPINE-OLMESARTAN 10-40 MG PO TABS: 0.5000 | ORAL_TABLET | Freq: Every day | ORAL | Status: DC

## 2012-11-16 NOTE — Telephone Encounter (Signed)
Med refilled.

## 2012-12-20 ENCOUNTER — Telehealth: Payer: Self-pay | Admitting: Family Medicine

## 2012-12-20 NOTE — Telephone Encounter (Signed)
Pt voice mail has not been set up will try again later

## 2012-12-20 NOTE — Telephone Encounter (Signed)
See if she is seeing new PCP first- she was going to change  If she is still coming here, okay to refill

## 2012-12-20 NOTE — Telephone Encounter (Signed)
?   Ok to refill.last ov 10/19/12

## 2012-12-22 NOTE — Telephone Encounter (Signed)
Pt voicemail has not been set up

## 2012-12-23 ENCOUNTER — Telehealth: Payer: Self-pay | Admitting: Family Medicine

## 2012-12-24 NOTE — Telephone Encounter (Signed)
Pt is suppose to be seeing Catalina Pizza MD Would not refill unless she calls wanting meds

## 2013-01-10 ENCOUNTER — Encounter: Payer: Self-pay | Admitting: Internal Medicine

## 2013-02-08 DIAGNOSIS — G8929 Other chronic pain: Secondary | ICD-10-CM | POA: Insufficient documentation

## 2013-03-23 DIAGNOSIS — M7742 Metatarsalgia, left foot: Secondary | ICD-10-CM | POA: Insufficient documentation

## 2013-03-23 DIAGNOSIS — G629 Polyneuropathy, unspecified: Secondary | ICD-10-CM | POA: Insufficient documentation

## 2013-06-23 DIAGNOSIS — L859 Epidermal thickening, unspecified: Secondary | ICD-10-CM | POA: Insufficient documentation

## 2014-02-23 ENCOUNTER — Other Ambulatory Visit (HOSPITAL_COMMUNITY): Payer: Self-pay | Admitting: Internal Medicine

## 2014-02-23 DIAGNOSIS — M8580 Other specified disorders of bone density and structure, unspecified site: Secondary | ICD-10-CM

## 2014-02-28 ENCOUNTER — Other Ambulatory Visit (HOSPITAL_COMMUNITY): Payer: Medicare Other

## 2014-03-08 ENCOUNTER — Other Ambulatory Visit (HOSPITAL_COMMUNITY): Payer: Medicare Other

## 2014-03-09 ENCOUNTER — Ambulatory Visit (HOSPITAL_COMMUNITY)
Admission: RE | Admit: 2014-03-09 | Discharge: 2014-03-09 | Disposition: A | Discharge status: Home / Self Care | Payer: Medicare Other | Source: Ambulatory Visit | Attending: Internal Medicine | Admitting: Internal Medicine

## 2014-03-09 DIAGNOSIS — R29890 Loss of height: Secondary | ICD-10-CM | POA: Insufficient documentation

## 2014-03-09 DIAGNOSIS — Z09 Encounter for follow-up examination after completed treatment for conditions other than malignant neoplasm: Secondary | ICD-10-CM | POA: Insufficient documentation

## 2014-03-09 DIAGNOSIS — M8580 Other specified disorders of bone density and structure, unspecified site: Secondary | ICD-10-CM

## 2014-03-09 DIAGNOSIS — M810 Age-related osteoporosis without current pathological fracture: Secondary | ICD-10-CM | POA: Insufficient documentation

## 2014-05-01 ENCOUNTER — Other Ambulatory Visit (HOSPITAL_COMMUNITY): Payer: Self-pay | Admitting: Internal Medicine

## 2014-05-01 DIAGNOSIS — Z1231 Encounter for screening mammogram for malignant neoplasm of breast: Secondary | ICD-10-CM

## 2014-05-02 ENCOUNTER — Encounter: Payer: Self-pay | Admitting: Adult Health

## 2014-05-15 ENCOUNTER — Ambulatory Visit (HOSPITAL_COMMUNITY)
Admission: RE | Admit: 2014-05-15 | Discharge: 2014-05-15 | Disposition: A | Discharge status: Home / Self Care | Payer: Medicare Other | Source: Ambulatory Visit | Attending: Internal Medicine | Admitting: Internal Medicine

## 2014-05-15 DIAGNOSIS — Z1231 Encounter for screening mammogram for malignant neoplasm of breast: Secondary | ICD-10-CM | POA: Diagnosis not present

## 2014-07-13 DIAGNOSIS — E039 Hypothyroidism, unspecified: Secondary | ICD-10-CM | POA: Diagnosis not present

## 2014-07-13 DIAGNOSIS — E119 Type 2 diabetes mellitus without complications: Secondary | ICD-10-CM | POA: Diagnosis not present

## 2014-07-24 DIAGNOSIS — M810 Age-related osteoporosis without current pathological fracture: Secondary | ICD-10-CM | POA: Diagnosis not present

## 2014-07-24 DIAGNOSIS — F321 Major depressive disorder, single episode, moderate: Secondary | ICD-10-CM | POA: Diagnosis not present

## 2014-07-24 DIAGNOSIS — F411 Generalized anxiety disorder: Secondary | ICD-10-CM | POA: Diagnosis not present

## 2014-07-24 DIAGNOSIS — E119 Type 2 diabetes mellitus without complications: Secondary | ICD-10-CM | POA: Diagnosis not present

## 2014-07-25 ENCOUNTER — Other Ambulatory Visit (HOSPITAL_COMMUNITY): Payer: Self-pay | Admitting: Internal Medicine

## 2014-07-25 DIAGNOSIS — R1012 Left upper quadrant pain: Secondary | ICD-10-CM

## 2014-07-28 ENCOUNTER — Ambulatory Visit (HOSPITAL_COMMUNITY)
Admission: RE | Admit: 2014-07-28 | Discharge: 2014-07-28 | Disposition: A | Discharge status: Home / Self Care | Payer: Medicare Other | Source: Ambulatory Visit | Attending: Internal Medicine | Admitting: Internal Medicine

## 2014-07-28 DIAGNOSIS — R1012 Left upper quadrant pain: Secondary | ICD-10-CM

## 2014-07-28 DIAGNOSIS — N281 Cyst of kidney, acquired: Secondary | ICD-10-CM | POA: Diagnosis not present

## 2014-08-07 DIAGNOSIS — H673 Otitis media in diseases classified elsewhere, bilateral: Secondary | ICD-10-CM | POA: Diagnosis not present

## 2014-10-27 DIAGNOSIS — E119 Type 2 diabetes mellitus without complications: Secondary | ICD-10-CM | POA: Diagnosis not present

## 2014-11-01 DIAGNOSIS — E119 Type 2 diabetes mellitus without complications: Secondary | ICD-10-CM | POA: Diagnosis not present

## 2014-11-01 DIAGNOSIS — G4700 Insomnia, unspecified: Secondary | ICD-10-CM | POA: Diagnosis not present

## 2014-11-01 DIAGNOSIS — I10 Essential (primary) hypertension: Secondary | ICD-10-CM | POA: Diagnosis not present

## 2014-11-01 DIAGNOSIS — E039 Hypothyroidism, unspecified: Secondary | ICD-10-CM | POA: Diagnosis not present

## 2014-12-05 DIAGNOSIS — M79672 Pain in left foot: Secondary | ICD-10-CM | POA: Diagnosis not present

## 2015-01-09 DIAGNOSIS — M79672 Pain in left foot: Secondary | ICD-10-CM | POA: Diagnosis not present

## 2015-01-18 DIAGNOSIS — M722 Plantar fascial fibromatosis: Secondary | ICD-10-CM | POA: Diagnosis not present

## 2015-01-18 DIAGNOSIS — S92335A Nondisplaced fracture of third metatarsal bone, left foot, initial encounter for closed fracture: Secondary | ICD-10-CM | POA: Diagnosis not present

## 2015-01-18 DIAGNOSIS — M79675 Pain in left toe(s): Secondary | ICD-10-CM | POA: Diagnosis not present

## 2015-01-18 DIAGNOSIS — M79672 Pain in left foot: Secondary | ICD-10-CM | POA: Diagnosis not present

## 2015-01-19 DIAGNOSIS — S92909A Unspecified fracture of unspecified foot, initial encounter for closed fracture: Secondary | ICD-10-CM | POA: Diagnosis not present

## 2015-02-08 DIAGNOSIS — M79675 Pain in left toe(s): Secondary | ICD-10-CM | POA: Diagnosis not present

## 2015-02-08 DIAGNOSIS — M79672 Pain in left foot: Secondary | ICD-10-CM | POA: Diagnosis not present

## 2015-02-08 DIAGNOSIS — S92335D Nondisplaced fracture of third metatarsal bone, left foot, subsequent encounter for fracture with routine healing: Secondary | ICD-10-CM | POA: Diagnosis not present

## 2015-02-15 DIAGNOSIS — Z23 Encounter for immunization: Secondary | ICD-10-CM | POA: Diagnosis not present

## 2015-03-08 DIAGNOSIS — M79675 Pain in left toe(s): Secondary | ICD-10-CM | POA: Diagnosis not present

## 2015-03-08 DIAGNOSIS — M79672 Pain in left foot: Secondary | ICD-10-CM | POA: Diagnosis not present

## 2015-03-08 DIAGNOSIS — S92335D Nondisplaced fracture of third metatarsal bone, left foot, subsequent encounter for fracture with routine healing: Secondary | ICD-10-CM | POA: Diagnosis not present

## 2015-03-15 DIAGNOSIS — E039 Hypothyroidism, unspecified: Secondary | ICD-10-CM | POA: Diagnosis not present

## 2015-03-15 DIAGNOSIS — I10 Essential (primary) hypertension: Secondary | ICD-10-CM | POA: Diagnosis not present

## 2015-03-15 DIAGNOSIS — E119 Type 2 diabetes mellitus without complications: Secondary | ICD-10-CM | POA: Diagnosis not present

## 2015-03-20 DIAGNOSIS — F411 Generalized anxiety disorder: Secondary | ICD-10-CM | POA: Diagnosis not present

## 2015-03-20 DIAGNOSIS — I10 Essential (primary) hypertension: Secondary | ICD-10-CM | POA: Diagnosis not present

## 2015-03-20 DIAGNOSIS — G4700 Insomnia, unspecified: Secondary | ICD-10-CM | POA: Diagnosis not present

## 2015-03-20 DIAGNOSIS — E039 Hypothyroidism, unspecified: Secondary | ICD-10-CM | POA: Diagnosis not present

## 2015-03-20 DIAGNOSIS — E119 Type 2 diabetes mellitus without complications: Secondary | ICD-10-CM | POA: Diagnosis not present

## 2015-04-09 ENCOUNTER — Other Ambulatory Visit (HOSPITAL_COMMUNITY): Payer: Self-pay | Admitting: Internal Medicine

## 2015-04-09 DIAGNOSIS — Z1231 Encounter for screening mammogram for malignant neoplasm of breast: Secondary | ICD-10-CM

## 2015-04-25 DIAGNOSIS — H353131 Nonexudative age-related macular degeneration, bilateral, early dry stage: Secondary | ICD-10-CM | POA: Diagnosis not present

## 2015-04-25 DIAGNOSIS — H43813 Vitreous degeneration, bilateral: Secondary | ICD-10-CM | POA: Diagnosis not present

## 2015-04-25 DIAGNOSIS — H524 Presbyopia: Secondary | ICD-10-CM | POA: Diagnosis not present

## 2015-04-25 DIAGNOSIS — E119 Type 2 diabetes mellitus without complications: Secondary | ICD-10-CM | POA: Diagnosis not present

## 2015-04-25 DIAGNOSIS — Z961 Presence of intraocular lens: Secondary | ICD-10-CM | POA: Diagnosis not present

## 2015-05-18 ENCOUNTER — Ambulatory Visit (HOSPITAL_COMMUNITY): Payer: Medicare Other

## 2015-06-06 ENCOUNTER — Ambulatory Visit (HOSPITAL_COMMUNITY)
Admission: RE | Admit: 2015-06-06 | Discharge: 2015-06-06 | Disposition: A | Discharge status: Home / Self Care | Payer: Medicare Other | Source: Ambulatory Visit | Attending: Internal Medicine | Admitting: Internal Medicine

## 2015-06-06 DIAGNOSIS — Z1231 Encounter for screening mammogram for malignant neoplasm of breast: Secondary | ICD-10-CM | POA: Diagnosis not present

## 2015-06-23 DIAGNOSIS — M25561 Pain in right knee: Secondary | ICD-10-CM | POA: Diagnosis not present

## 2015-06-23 DIAGNOSIS — M25562 Pain in left knee: Secondary | ICD-10-CM | POA: Diagnosis not present

## 2015-06-26 ENCOUNTER — Emergency Department (HOSPITAL_COMMUNITY): Payer: Medicare Other

## 2015-06-26 ENCOUNTER — Emergency Department (HOSPITAL_COMMUNITY)
Admission: EM | Admit: 2015-06-26 | Discharge: 2015-06-26 | Disposition: A | Discharge status: Home / Self Care | Payer: Medicare Other | Attending: Emergency Medicine | Admitting: Emergency Medicine

## 2015-06-26 ENCOUNTER — Encounter (HOSPITAL_COMMUNITY): Payer: Self-pay | Admitting: Emergency Medicine

## 2015-06-26 DIAGNOSIS — R600 Localized edema: Secondary | ICD-10-CM | POA: Insufficient documentation

## 2015-06-26 DIAGNOSIS — E079 Disorder of thyroid, unspecified: Secondary | ICD-10-CM | POA: Insufficient documentation

## 2015-06-26 DIAGNOSIS — I10 Essential (primary) hypertension: Secondary | ICD-10-CM | POA: Diagnosis not present

## 2015-06-26 DIAGNOSIS — R0789 Other chest pain: Secondary | ICD-10-CM | POA: Diagnosis not present

## 2015-06-26 DIAGNOSIS — F419 Anxiety disorder, unspecified: Secondary | ICD-10-CM | POA: Diagnosis not present

## 2015-06-26 DIAGNOSIS — R609 Edema, unspecified: Secondary | ICD-10-CM

## 2015-06-26 DIAGNOSIS — M179 Osteoarthritis of knee, unspecified: Secondary | ICD-10-CM | POA: Diagnosis not present

## 2015-06-26 DIAGNOSIS — M25562 Pain in left knee: Secondary | ICD-10-CM | POA: Diagnosis not present

## 2015-06-26 DIAGNOSIS — M1712 Unilateral primary osteoarthritis, left knee: Secondary | ICD-10-CM | POA: Insufficient documentation

## 2015-06-26 DIAGNOSIS — F329 Major depressive disorder, single episode, unspecified: Secondary | ICD-10-CM | POA: Insufficient documentation

## 2015-06-26 DIAGNOSIS — Z8719 Personal history of other diseases of the digestive system: Secondary | ICD-10-CM | POA: Diagnosis not present

## 2015-06-26 DIAGNOSIS — Z7982 Long term (current) use of aspirin: Secondary | ICD-10-CM | POA: Diagnosis not present

## 2015-06-26 DIAGNOSIS — E119 Type 2 diabetes mellitus without complications: Secondary | ICD-10-CM | POA: Diagnosis not present

## 2015-06-26 DIAGNOSIS — Z79899 Other long term (current) drug therapy: Secondary | ICD-10-CM | POA: Diagnosis not present

## 2015-06-26 DIAGNOSIS — Z8742 Personal history of other diseases of the female genital tract: Secondary | ICD-10-CM | POA: Diagnosis not present

## 2015-06-26 DIAGNOSIS — R0602 Shortness of breath: Secondary | ICD-10-CM | POA: Diagnosis not present

## 2015-06-26 LAB — BASIC METABOLIC PANEL
ANION GAP: 8 (ref 5–15)
BUN: 21 mg/dL — ABNORMAL HIGH (ref 6–20)
CALCIUM: 9.3 mg/dL (ref 8.9–10.3)
CHLORIDE: 104 mmol/L (ref 101–111)
CO2: 30 mmol/L (ref 22–32)
CREATININE: 0.74 mg/dL (ref 0.44–1.00)
GFR calc non Af Amer: >60 mL/min (ref >60)
Glucose, Bld: 150 mg/dL — ABNORMAL HIGH (ref 65–99)
Potassium: 3.6 mmol/L (ref 3.5–5.1)
SODIUM: 142 mmol/L (ref 135–145)

## 2015-06-26 LAB — CBC
HEMATOCRIT: 40.1 % (ref 36.0–46.0)
HEMOGLOBIN: 12.5 g/dL (ref 12.0–15.0)
MCH: 25.2 pg — ABNORMAL LOW (ref 26.0–34.0)
MCHC: 31.2 g/dL (ref 30.0–36.0)
MCV: 80.7 fL (ref 78.0–100.0)
Platelets: 209 10*3/uL (ref 150–400)
RBC: 4.97 MIL/uL (ref 3.87–5.11)
RDW: 15.4 % (ref 11.5–15.5)
WBC: 7.7 10*3/uL (ref 4.0–10.5)

## 2015-06-26 LAB — TROPONIN I

## 2015-06-26 MED ORDER — IBUPROFEN 400 MG PO TABS
400.0000 mg | ORAL_TABLET | Freq: Once | ORAL | Status: AC
  Administered 2015-06-26: 400 mg via ORAL
  Filled 2015-06-26: qty 1

## 2015-06-26 NOTE — ED Notes (Signed)
While patient is in triage, this nurse noted patient put her hand to the middle of her chest. Asked patient if she is having chest pain and patient states "yes a little, but it may be because I am nervous." Patient continued to complain of chest pain upon arrival to room. EKG performed on patient and given to Dr Denton Lank.

## 2015-06-26 NOTE — ED Provider Notes (Signed)
CSN: [id number]     Arrival date & time 06/26/15  1748 History   First MD Initiated Contact with Patient 06/26/15 1806     Chief Complaint  Patient presents with  . Leg Swelling     (Consider location/radiation/quality/duration/timing/severity/associated sxs/prior Treatment) The history is provided by the patient.  Patient c/o bilateral knee and lower leg pain and swelling for the past couple weeks. No hx same. No acute or abrupt change today.   Denies redness. No fever or chills. Knee pain worse ambulating.  ?hx arthritis.  Denies fall or injury.  Pt denies chest pain, but states while in triage, she got a 'picky pain' in chest lasting a second or two.  States similar very brief chest pain at rest recently.   No more prolonged or persistent chest pain.   No unusual doe or fatigue. No associated sob, nv or diaphoresis.  Pt denies orthopnea or pnd.       Past Medical History  Diagnosis Date  . Hypertension   . Diabetes mellitus without complication (HCC)   . Depression   . Thyroid disease   . Arthritis   . Back pain   . Anxiety   . Hyperlipidemia   . Rectocele   . Cystocele   . Constipation    Past Surgical History  Procedure Laterality Date  . Abdominal hysterectomy    . Cataract extraction  2004 and 2008  . Esophagogastroduodenoscopy  01/01/2009    FAO:[id number] hiatal hernia.Normal esophagus.Antral nodule, status post biopsy  . Colonoscopy  2004    Dr Rayann Heman  . Colonoscopy N/A 08/12/2012    Procedure: COLONOSCOPY;  Surgeon: Corbin Ade, MD;  Location: [facility];  Service: Endoscopy;  Laterality: N/A;  11:30   Family History  Problem Relation Age of Onset  . Osteoporosis Mother   . Heart disease Father   . Hyperlipidemia Father   . Hypertension Father   . Depression Sister   . Cancer Sister     metastatic  . Diabetes Sister   . Colon cancer Sister    Social History  Substance Use Topics  . Smoking status: Never Smoker   . Smokeless tobacco:  None  . Alcohol Use: No   OB History    No data available     Review of Systems  Constitutional: Negative for fever and chills.  HENT: Negative for sore throat.   Eyes: Negative for redness.  Respiratory: Negative for cough and shortness of breath.   Cardiovascular: Negative for chest pain.  Gastrointestinal: Negative for vomiting, abdominal pain and diarrhea.  Genitourinary: Negative for flank pain.  Musculoskeletal: Negative for back pain and neck pain.  Skin: Negative for rash.  Neurological: Negative for headaches.  Hematological: Does not bruise/bleed easily.  Psychiatric/Behavioral: Negative for confusion.      Allergies  Codeine  Home Medications   Prior to Admission medications   Medication Sig Start Date End Date Taking? Authorizing Provider  amLODipine-olmesartan (AZOR) 10-40 MG per tablet Take 0.5 tablets by mouth daily. 11/16/12   [name], MD  aspirin 81 MG tablet Take 81 mg by mouth daily.    Historical Provider, MD  busPIRone (BUSPAR) 7.5 MG tablet Take 1 tablet (7.5 mg total) by mouth 2 (two) times daily. For stress 10/19/12   [name], MD  Calcium Carbonate-Vitamin D (CALCIUM 600+D) 600-400 MG-UNIT per tablet Take 2 tablets by mouth daily.    Historical Provider, MD  clonazePAM (KLONOPIN) 1 MG tablet Take 1 mg  by mouth at bedtime as needed for anxiety.    Historical Provider, MD  hydrochlorothiazide (HYDRODIURIL) 25 MG tablet Take 1 tablet (25 mg total) by mouth daily. 10/06/12   [name], MD  levothyroxine (SYNTHROID, LEVOTHROID) 50 MCG tablet Take 1 tablet (50 mcg total) by mouth daily. 05/11/12   [name], MD  ONE TOUCH ULTRA TEST test strip  09/24/12   Historical Provider, MD  traMADol (ULTRAM) 50 MG tablet Take 50 mg by mouth. 08/09/12   Historical Provider, MD   BP 152/77 mmHg  Pulse 117  Temp(Src) 99.1 F (37.3 C) (Oral)  Resp 20  Ht  (1.702 m)  Wt 83.915 kg  BMI 28.97 kg/m2  SpO2 99% Physical Exam   Constitutional: She appears well-developed and well-nourished. No distress.  HENT:  Mouth/Throat: Oropharynx is clear and moist.  Eyes: Conjunctivae are normal. No scleral icterus.  Neck: Neck supple. No tracheal deviation present.  Cardiovascular: Normal rate, regular rhythm, normal heart sounds and intact distal pulses.  Exam reveals no gallop and no friction rub.   No murmur heard. Pulmonary/Chest: Effort normal and breath sounds normal. No respiratory distress. She exhibits no tenderness.  Abdominal: Soft. Normal appearance and bowel sounds are normal. She exhibits no distension. There is no tenderness.  Musculoskeletal:  Mild bilateral ankle edema, symmetric. Left knee pain w active rom.  No erythema. No effusion. No findings of septic joint. Fem and distal pulses palp bil, 2+. No calf swelling, pain or tenderness.   Neurological: She is alert.  Skin: Skin is warm and dry. No rash noted. She is not diaphoretic.  Psychiatric: She has a normal mood and affect.  Nursing note and vitals reviewed.   ED Course  Procedures (including critical care time) Labs Review  Results for orders placed or performed during the hospital encounter of 06/26/15  CBC  Result Value Ref Range   WBC 7.7 4.0 - 10.5 K/uL   RBC 4.97 3.87 - 5.11 MIL/uL   Hemoglobin 12.5 12.0 - 15.0 g/dL   HCT 16.1 09.6 - 04.5 %   MCV 80.7 78.0 - 100.0 fL   MCH 25.2 (L) 26.0 - 34.0 pg   MCHC 31.2 30.0 - 36.0 g/dL   RDW 40.9 81.1 - 91.4 %   Platelets 209 150 - 400 K/uL  Basic metabolic panel  Result Value Ref Range   Sodium 142 135 - 145 mmol/L   Potassium 3.6 3.5 - 5.1 mmol/L   Chloride 104 101 - 111 mmol/L   CO2 30 22 - 32 mmol/L   Glucose, Bld 150 (H) 65 - 99 mg/dL   BUN 21 (H) 6 - 20 mg/dL   Creatinine, Ser 7.82 0.44 - 1.00 mg/dL   Calcium 9.3 8.9 - 95.6 mg/dL   GFR calc non Af Amer >60 >60 mL/min   GFR calc Af Amer >60 >60 mL/min   Anion gap 8 5 - 15  Troponin I  Result Value Ref Range   Troponin I <0.03  <0.031 ng/mL   Dg Chest 2 View  06/26/2015  CLINICAL DATA:  Chest pain, weakness and shortness of breath today. Initial encounter. EXAM: CHEST  2 VIEW COMPARISON:  PA and lateral chest 02/24/2005. FINDINGS: The lungs are clear. Heart size is normal. No pneumothorax or pleural effusion. Moderate to large hiatal hernia is noted. IMPRESSION: No acute disease. Hiatal hernia. Electronically Signed   By: Drusilla Kanner M.D.   On: 06/26/2015 18:52   Dg Knee Complete 4 Views  Left  06/26/2015  CLINICAL DATA:  Patient with increased knee pain and swelling. Weakness. Initial encounter. EXAM: LEFT KNEE - COMPLETE 4+ VIEW COMPARISON:  Knee radiograph 10/09/2003. FINDINGS: Normal anatomic alignment. No evidence for acute fracture or dislocation. Mild tricompartmental osteoarthritis. Chondrocalcinosis. No joint effusion. Vascular calcifications. Healed proximal fibular fracture. IMPRESSION: No acute osseous abnormality. Degenerative changes. Electronically Signed   By: Annia Belt M.D.   On: 06/26/2015 18:51    I have personally reviewed and evaluated these images and lab results as part of my medical decision-making.   EKG Interpretation   Date/Time:  Tuesday June 26 2015 18:05:39 EST Ventricular Rate:  109 PR Interval:  163 QRS Duration: 100 QT Interval:  340 QTC Calculation: 458 R Axis:   59 Text Interpretation:  Sinus tachycardia Borderline repolarization  abnormality Nonspecific T wave abnormality No significant change since  last tracing Confirmed by Denton Lank  MD, Caryn Bee ([id number]) on 06/26/2015 6:10:18  PM      MDM   Labs.  Reviewed nursing notes and prior charts for additional history.   Patient chest pain sounds not c/w ACS, at rest, very brief, localized.   No current cp or discomfort.  Labs/imaging pending.  No meds pta.  Pt drove self to ED.  Motrin po.   Awaiting labs.   Trop neg, and symptoms do not appears c/w acs. cxr neg.  Pt with deg change on knee xrays, c/w symptoms.  No findings infected or septic joint on exam.   No increased wob.  No current cp.  Pt currently appears stable for d/c.  rec close pcp f/u.  Return precautions provided.       Cathren Laine, MD 06/26/15 2025

## 2015-06-26 NOTE — Discharge Instructions (Signed)
It was our pleasure to provide your ER care today - we hope that you feel better.  Your lab work looks good.  Your xrays show degenerative changes/arthritis in the knee.   For knee pain, take motrin or aleve as need for pain.  You may also take your ultram as need.  For swelling - limit salt intake, and elevate legs.  Follow up with your doctor for recheck in the next 1-2 weeks - call office to arrange appointment.    Return to ER if worse, new symptoms, fevers, difficulty breathing, persistent/recurrent chest pain, other concern.     Osteoarthritis Osteoarthritis is a disease that causes soreness and inflammation of a joint. It occurs when the cartilage at the affected joint wears down. Cartilage acts as a cushion, covering the ends of bones where they meet to form a joint. Osteoarthritis is the most common form of arthritis. It often occurs in older people. The joints affected most often by this condition include those in the:  Ends of the fingers.  Thumbs.  Neck.  Lower back.  Knees.  Hips. CAUSES  Over time, the cartilage that covers the ends of bones begins to wear away. This causes bone to rub on bone, producing pain and stiffness in the affected joints.  RISK FACTORS Certain factors can increase your chances of having osteoarthritis, including:  Older age.  Excessive body weight.  Overuse of joints.  Previous joint injury. SIGNS AND SYMPTOMS   Pain, swelling, and stiffness in the joint.  Over time, the joint may lose its normal shape.  Small deposits of bone (osteophytes) may grow on the edges of the joint.  Bits of bone or cartilage can break off and float inside the joint space. This may cause more pain and damage. DIAGNOSIS  Your health care provider will do a physical exam and ask about your symptoms. Various tests may be ordered, such as:  X-rays of the affected joint.  Blood tests to rule out other types of arthritis. Additional tests may be used  to diagnose your condition. TREATMENT  Goals of treatment are to control pain and improve joint function. Treatment plans may include:  A prescribed exercise program that allows for rest and joint relief.  A weight control plan.  Pain relief techniques, such as:  Properly applied heat and cold.  Electric pulses delivered to nerve endings under the skin (transcutaneous electrical nerve stimulation [TENS]).  Massage.  Certain nutritional supplements.  Medicines to control pain, such as:  Acetaminophen.  Nonsteroidal anti-inflammatory drugs (NSAIDs), such as naproxen.  Narcotic or central-acting agents, such as tramadol.  Corticosteroids. These can be given orally or as an injection.  Surgery to reposition the bones and relieve pain (osteotomy) or to remove loose pieces of bone and cartilage. Joint replacement may be needed in advanced states of osteoarthritis. HOME CARE INSTRUCTIONS   Take medicines only as directed by your health care provider.  Maintain a healthy weight. Follow your health care provider's instructions for weight control. This may include dietary instructions.  Exercise as directed. Your health care provider can recommend specific types of exercise. These may include:  Strengthening exercises. These are done to strengthen the muscles that support joints affected by arthritis. They can be performed with weights or with exercise bands to add resistance.  Aerobic activities. These are exercises, such as brisk walking or low-impact aerobics, that get your heart pumping.  Range-of-motion activities. These keep your joints limber.  Balance and agility exercises. These help you  maintain daily living skills.  Rest your affected joints as directed by your health care provider.  Keep all follow-up visits as directed by your health care provider. SEEK MEDICAL CARE IF:   Your skin turns red.  You develop a rash in addition to your joint pain.  You have  worsening joint pain.  You have a fever along with joint or muscle aches. SEEK IMMEDIATE MEDICAL CARE IF:  You have a significant loss of weight or appetite.  You have night sweats. FOR MORE INFORMATION   National Institute of Arthritis and Musculoskeletal and Skin Diseases: www.niams.http://www.myers.net/  [employer] on Aging: https://walker.com/  American College of Rheumatology: www.rheumatology.org   This information is not intended to replace advice given to you by your health care provider. Make sure you discuss any questions you have with your health care provider.   Document Released: 05/19/2005 Document Revised: 06/09/2014 Document Reviewed: 01/24/2013 Elsevier Interactive Patient Education 2016 [employer] Inc[employer]  Knee Pain Knee pain is a common problem. It can have many causes. The pain often goes away by following your doctor's home care instructions. Treatment for ongoing pain will depend on the cause of your pain. If your knee pain continues, more tests may be needed to diagnose your condition. Tests may include X-rays or other imaging studies of your knee. HOME CARE  Take medicines only as told by your doctor.  Rest your knee and keep it raised (elevated) while you are resting.  Do not do things that cause pain or make your pain worse.  Avoid activities where both feet leave the ground at the same time, such as running, jumping rope, or doing jumping jacks.  Apply ice to the knee area:  Put ice in a plastic bag.  Place a towel between your skin and the bag.  Leave the ice on for 20 minutes, 2-3 times a day.  Ask your doctor if you should wear an elastic knee support.  Sleep with a pillow under your knee.  Lose weight if you are overweight. Being overweight can make your knee hurt more.  Do not use any tobacco products, including cigarettes, chewing tobacco, or electronic cigarettes. If you need help quitting, ask your doctor. Smoking may slow the healing of any bone  and joint problems that you may have. GET HELP IF:  Your knee pain does not stop, it changes, or it gets worse.  You have a fever along with knee pain.  Your knee gives out or locks up.  Your knee becomes more swollen. GET HELP RIGHT AWAY IF:   Your knee feels hot to the touch.  You have chest pain or trouble breathing.   This information is not intended to replace advice given to you by your health care provider. Make sure you discuss any questions you have with your health care provider.   Document Released: 08/15/2008 Document Revised: 06/09/2014 Document Reviewed: 07/20/2013 Elsevier Interactive Patient Education 2016 Else[employer] Inc.    Peripheral Edema You have swelling in your legs (peripheral edema). This swelling is due to excess accumulation of salt and water in your body. Edema may be a sign of heart, kidney or liver disease, or a side effect of a medication. It may also be due to problems in the leg veins. Elevating your legs and using special support stockings may be very helpful, if the cause of the swelling is due to poor venous circulation. Avoid long periods of standing, whatever the cause. Treatment of edema depends on identifying the cause.  Chips, pretzels, pickles and other salty foods should be avoided. Restricting salt in your diet is almost always needed. Water pills (diuretics) are often used to remove the excess salt and water from your body via urine. These medicines prevent the kidney from reabsorbing sodium. This increases urine flow. Diuretic treatment may also result in lowering of potassium levels in your body. Potassium supplements may be needed if you have to use diuretics daily. Daily weights can help you keep track of your progress in clearing your edema. You should call your caregiver for follow up care as recommended. SEEK IMMEDIATE MEDICAL CARE IF:   You have increased swelling, pain, redness, or heat in your legs.  You develop shortness of breath,  especially when lying down.  You develop chest or abdominal pain, weakness, or fainting.  You have a fever.   This information is not intended to replace advice given to you by your health care provider. Make sure you discuss any questions you have with your health care provider.   Document Released: 06/26/2004 Document Revised: 08/11/2011 Document Reviewed: 11/29/2014 Elsevier Interactive Patient Education 2016 Else[employer] Inc.    Nonspecific Chest Pain It is often hard to find the cause of chest pain. There is always a chance that your pain could be related to something serious, such as a heart attack or a blood clot in your lungs. Chest pain can also be caused by conditions that are not life-threatening. If you have chest pain, it is very important to follow up with your doctor.  HOME CARE  If you were prescribed an antibiotic medicine, finish it all even if you start to feel better.  Avoid any activities that cause chest pain.  Do not use any tobacco products, including cigarettes, chewing tobacco, or electronic cigarettes. If you need help quitting, ask your doctor.  Do not drink alcohol.  Take medicines only as told by your doctor.  Keep all follow-up visits as told by your doctor. This is important. This includes any further testing if your chest pain does not go away.  Your doctor may tell you to keep your head raised (elevated) while you sleep.  Make lifestyle changes as told by your doctor. These may include:  Getting regular exercise. Ask your doctor to suggest some activities that are safe for you.  Eating a heart-healthy diet. Your doctor or a diet specialist (dietitian) can help you to learn healthy eating options.  Maintaining a healthy weight.  Managing diabetes, if necessary.  Reducing stress. GET HELP IF:  Your chest pain does not go away, even after treatment.  You have a rash with blisters on your chest.  You have a fever. GET HELP RIGHT AWAY  IF:  Your chest pain is worse.  You have an increasing cough, or you cough up blood.  You have severe belly (abdominal) pain.  You feel extremely weak.  You pass out (faint).  You have chills.  You have sudden, unexplained chest discomfort.  You have sudden, unexplained discomfort in your arms, back, neck, or jaw.  You have shortness of breath at any time.  You suddenly start to sweat, or your skin gets clammy.  You feel nauseous.  You vomit.  You suddenly feel light-headed or dizzy.  Your heart begins to beat quickly, or it feels like it is skipping beats. These symptoms may be an emergency. Do not wait to see if the symptoms will go away. Get medical help right away. Call your local emergency services (911 in  the [place] Do not drive yourself to the hospital.   This information is not intended to replace advice given to you by your health care provider. Make sure you discuss any questions you have with your health care provider.   Document Released: 11/05/2007 Document Revised: 06/09/2014 Document Reviewed: 12/23/2013 Elsevier Interactive Patient Education [employer]

## 2015-06-26 NOTE — ED Notes (Signed)
Patient complaining of bilateral lower leg pain and swelling x 2 weeks worsening x 1 week. Patient states she has appointment with Dr Romeo Apple on Feb 15.

## 2015-06-29 DIAGNOSIS — F5101 Primary insomnia: Secondary | ICD-10-CM | POA: Diagnosis not present

## 2015-06-29 DIAGNOSIS — N390 Urinary tract infection, site not specified: Secondary | ICD-10-CM | POA: Diagnosis not present

## 2015-07-06 DIAGNOSIS — E039 Hypothyroidism, unspecified: Secondary | ICD-10-CM | POA: Diagnosis not present

## 2015-07-06 DIAGNOSIS — D649 Anemia, unspecified: Secondary | ICD-10-CM | POA: Diagnosis not present

## 2015-07-06 DIAGNOSIS — I10 Essential (primary) hypertension: Secondary | ICD-10-CM | POA: Diagnosis not present

## 2015-07-06 DIAGNOSIS — E119 Type 2 diabetes mellitus without complications: Secondary | ICD-10-CM | POA: Diagnosis not present

## 2015-07-10 DIAGNOSIS — E039 Hypothyroidism, unspecified: Secondary | ICD-10-CM | POA: Diagnosis not present

## 2015-07-10 DIAGNOSIS — F5101 Primary insomnia: Secondary | ICD-10-CM | POA: Diagnosis not present

## 2015-07-10 DIAGNOSIS — I10 Essential (primary) hypertension: Secondary | ICD-10-CM | POA: Diagnosis not present

## 2015-07-10 DIAGNOSIS — E119 Type 2 diabetes mellitus without complications: Secondary | ICD-10-CM | POA: Diagnosis not present

## 2015-07-18 ENCOUNTER — Ambulatory Visit (INDEPENDENT_AMBULATORY_CARE_PROVIDER_SITE_OTHER): Payer: Medicare Other

## 2015-07-18 ENCOUNTER — Ambulatory Visit (INDEPENDENT_AMBULATORY_CARE_PROVIDER_SITE_OTHER): Payer: Medicare Other | Admitting: Orthopedic Surgery

## 2015-07-18 ENCOUNTER — Encounter: Payer: Self-pay | Admitting: Orthopedic Surgery

## 2015-07-18 VITALS — BP 151/79 | Ht 67.0 in | Wt 185.0 lb

## 2015-07-18 DIAGNOSIS — M25561 Pain in right knee: Secondary | ICD-10-CM

## 2015-07-18 DIAGNOSIS — M5410 Radiculopathy, site unspecified: Secondary | ICD-10-CM | POA: Diagnosis not present

## 2015-07-18 DIAGNOSIS — M129 Arthropathy, unspecified: Secondary | ICD-10-CM

## 2015-07-18 DIAGNOSIS — M25562 Pain in left knee: Secondary | ICD-10-CM | POA: Diagnosis not present

## 2015-07-18 DIAGNOSIS — M170 Bilateral primary osteoarthritis of knee: Secondary | ICD-10-CM

## 2015-07-18 MED ORDER — METHOCARBAMOL 500 MG PO TABS: 500.0000 mg | ORAL_TABLET | Freq: Four times a day (QID) | ORAL | Status: DC | PRN

## 2015-07-18 NOTE — Patient Instructions (Signed)
Joint Injection  Care After  Refer to this sheet in the next few days. These instructions provide you with information on caring for yourself after you have had a joint injection. Your caregiver also may give you more specific instructions. Your treatment has been planned according to current medical practices, but problems sometimes occur. Call your caregiver if you have any problems or questions after your procedure.  After any type of joint injection, it is not uncommon to experience:  · Soreness, swelling, or bruising around the injection site.  · Mild numbness, tingling, or weakness around the injection site caused by the numbing medicine used before or with the injection.  It also is possible to experience the following effects associated with the specific agent after injection:  · Iodine-based contrast agents:  ¨ Allergic reaction (itching, hives, widespread redness, and swelling beyond the injection site).  · Corticosteroids (These effects are rare.):  ¨ Allergic reaction.  ¨ Increased blood sugar levels (If you have diabetes and you notice that your blood sugar levels have increased, notify your caregiver).  ¨ Increased blood pressure levels.  ¨ Mood swings.  · Hyaluronic acid in the use of viscosupplementation.  ¨ Temporary heat or redness.  ¨ Temporary rash and itching.  ¨ Increased fluid accumulation in the injected joint.  These effects all should resolve within a day after your procedure.   HOME CARE INSTRUCTIONS  · Limit yourself to light activity the day of your procedure. Avoid lifting heavy objects, bending, stooping, or twisting.  · Take prescription or over-the-counter pain medication as directed by your caregiver.  · You may apply ice to your injection site to reduce pain and swelling the day of your procedure. Ice may be applied 03-04 times:  ¨ Put ice in a plastic bag.  ¨ Place a towel between your skin and the bag.  ¨ Leave the ice on for no longer than 15-20 minutes each time.  SEEK  IMMEDIATE MEDICAL CARE IF:   · Pain and swelling get worse rather than better or extend beyond the injection site.  · Numbness does not go away.  · Blood or fluid continues to leak from the injection site.  · You have chest pain.  · You have swelling of your face or tongue.  · You have trouble breathing or you become dizzy.  · You develop a fever, chills, or severe tenderness at the injection site that last longer than 1 day.  MAKE SURE YOU:  · Understand these instructions.  · Watch your condition.  · Get help right away if you are not doing well or if you get worse.  Document Released: 01/30/2011 Document Revised: 08/11/2011 Document Reviewed: 01/30/2011  ExitCare® Patient Information ©2015 [employer]. This information is not intended to replace advice given to you by your health care provider. Make sure you discuss any questions you have with your health care provider.

## 2015-07-18 NOTE — Progress Notes (Signed)
Patient ID: Natalie Long, female   DOB: [birth year], 80 y.o.   MRN: [id number]  Chief Complaint  Patient presents with  . Knee Pain    bilateral knee pain and swelling    HPI Natalie Long is a 80 y.o. female.  Presents with complaints of left knee greater than right knee pain but bilateral knee pain and back pain with cramping of the left leg for 6 weeks. No injury. Symptoms pain, swelling. Pain described as throbbing stabbing and aching all the way down the left leg. Pain is now constant. Symptoms result in pain that is 6 out of 10.  Treatment ibuprofen, diclofenac, tramadol. Results no improvement  Review of Systems Review of Systems  Constitutional: Positive for fatigue.  HENT: Positive for tinnitus.   Eyes: Positive for visual disturbance.  Respiratory: Positive for shortness of breath.   Cardiovascular: Positive for leg swelling.  Musculoskeletal: Positive for myalgias, back pain, joint swelling, arthralgias and gait problem.  Neurological: Positive for weakness.  Psychiatric/Behavioral: The patient is nervous/anxious.   All other systems reviewed and are negative.   Past Medical History  Diagnosis Date  . Hypertension   . Diabetes mellitus without complication (HCC)   . Depression   . Thyroid disease   . Arthritis   . Back pain   . Anxiety   . Hyperlipidemia   . Rectocele   . Cystocele   . Constipation     Past Surgical History  Procedure Laterality Date  . Abdominal hysterectomy    . Cataract extraction  2004 and 2008  . Esophagogastroduodenoscopy  01/01/2009    [id number] hiatal hernia.Normal esophagus.Antral nodule, status post biopsy  . Colonoscopy  2004    Dr Rayann Heman  . Colonoscopy N/A 08/12/2012    Procedure: COLONOSCOPY;  Surgeon: Corbin Ade, MD;  Location: [facility];  Service: Endoscopy;  Laterality: N/A;  11:30    Family History  Problem Relation Age of Onset  . Osteoporosis Mother   . Heart disease Father   .  Hyperlipidemia Father   . Hypertension Father   . Depression Sister   . Cancer Sister     metastatic  . Diabetes Sister   . Colon cancer Sister     Social History Social History  Substance Use Topics  . Smoking status: Never Smoker   . Smokeless tobacco: None  . Alcohol Use: No    Allergies  Allergen Reactions  . Codeine Nausea Only    Current Outpatient Prescriptions  Medication Sig Dispense Refill  . amLODipine-olmesartan (AZOR) 5-20 MG tablet Take 1 tablet by mouth daily.     Marland Kitchen aspirin 81 MG tablet Take 81 mg by mouth daily.    . Calcium Carbonate-Vitamin D (CALCIUM 600+D) 600-400 MG-UNIT per tablet Take 2 tablets by mouth daily.    . diclofenac sodium (VOLTAREN) 1 % GEL Apply topically 4 (four) times daily.    . hydrochlorothiazide (HYDRODIURIL) 25 MG tablet Take 1 tablet (25 mg total) by mouth daily. 90 tablet 1  . HYDROcodone-acetaminophen (NORCO/VICODIN) 5-325 MG tablet Take 1-2 tablets by mouth every 6 (six) hours as needed for moderate pain.     Marland Kitchen levothyroxine (SYNTHROID, LEVOTHROID) 75 MCG tablet Take 75 mcg by mouth daily before breakfast.    . metFORMIN (GLUCOPHAGE) 500 MG tablet Take 500 mg by mouth 2 (two) times daily with a meal.    . Multiple Vitamin (MULTIVITAMIN) capsule Take 1 capsule by mouth daily.    . Multiple Vitamins-Minerals (VISION-VITE PRESERVE  PO) Take by mouth.    Marland Kitchen omeprazole (PRILOSEC) 20 MG capsule Take 20 mg by mouth daily.     . traMADol (ULTRAM) 50 MG tablet Take 50 mg by mouth daily as needed for moderate pain or severe pain.      No current facility-administered medications for this visit.       Physical Exam Physical Exam Blood pressure 151/79, height  (1.702 m), weight 185 lb (83.915 kg). Appearance, there are no abnormalities in terms of appearance the patient was well-developed and well-nourished. The grooming and hygiene were normal.  Mental status orientation, there was normal alertness and orientation Mood  pleasant Ambulatory status is abnormal favoring the left side. Slightly crouched gait with increased lumbosacral flexion  Examination of the lumbar spine reveal tenderness at the L4-5 disc space which is bilateral and includes the gluteal area   Decreased spinal flexion extension and rotation were noted.  The lower extremities exhibited normal dorsiflexion plantar flexion as well as hip flexion. Right knee flexion 5-120 degrees left knee flexion 5-120 degrees  No atrophy was noted. The skin overlying the lumbar thoracic and cervical regions was warm dry and intact without laceration. The lower extremities also had normal skin.  Upper extremities inspection mild degenerative changes in the hand small joints but no tenderness. Full range of motion. Stability test no subluxation strength tests were normal skin intact pulses were good temperature normal no lymph nodes normal sensation  Neurologic examination  Reflexes were 2+ and equal  Sensation was normal in both feet and legs  Babinski's tests were down going  Straight leg raise testing was normal bilaterally  The vascular examination revealed normal dorsalis pedis pulses in both feet and both feet were warm with good capillary refill And fairly significant amount of varicose veins  Data Reviewed Plain films were ordered. I interpreted those films as chondrocalcinosis with valgus osteoarthritis  Assessment  Bilateral osteoarthritis chondrocalcinosis right knee  Back pain with left leg pain   Plan  Inject both knees Robaxin 500 mg every 6 #56 1 refill  Follow-up if any problems   Procedure note left knee injection verbal consent was obtained to inject left knee joint  Timeout was completed to confirm the site of injection  The medications used were 40 mg of Depo-Medrol and 1% lidocaine 3 cc  Anesthesia was provided by ethyl chloride and the skin was prepped with alcohol.  After cleaning the skin with alcohol a 20-gauge  needle was used to inject the left knee joint. There were no complications. A sterile bandage was applied.   Procedure note right knee injection verbal consent was obtained to inject right knee joint  Timeout was completed to confirm the site of injection  The medications used were 40 mg of Depo-Medrol and 1% lidocaine 3 cc  Anesthesia was provided by ethyl chloride and the skin was prepped with alcohol.  After cleaning the skin with alcohol a 20-gauge needle was used to inject the right knee joint. There were no complications. A sterile bandage was applied.

## 2015-08-27 DIAGNOSIS — J060 Acute laryngopharyngitis: Secondary | ICD-10-CM | POA: Diagnosis not present

## 2015-08-27 DIAGNOSIS — R05 Cough: Secondary | ICD-10-CM | POA: Diagnosis not present

## 2015-09-03 DIAGNOSIS — I959 Hypotension, unspecified: Secondary | ICD-10-CM | POA: Diagnosis not present

## 2015-09-03 DIAGNOSIS — J00 Acute nasopharyngitis [common cold]: Secondary | ICD-10-CM | POA: Diagnosis not present

## 2015-10-19 DIAGNOSIS — H05011 Cellulitis of right orbit: Secondary | ICD-10-CM | POA: Diagnosis not present

## 2015-10-24 DIAGNOSIS — H353132 Nonexudative age-related macular degeneration, bilateral, intermediate dry stage: Secondary | ICD-10-CM | POA: Diagnosis not present

## 2015-10-31 DIAGNOSIS — E119 Type 2 diabetes mellitus without complications: Secondary | ICD-10-CM | POA: Diagnosis not present

## 2015-10-31 DIAGNOSIS — E1159 Type 2 diabetes mellitus with other circulatory complications: Secondary | ICD-10-CM | POA: Diagnosis not present

## 2015-11-05 DIAGNOSIS — E782 Mixed hyperlipidemia: Secondary | ICD-10-CM | POA: Diagnosis not present

## 2015-11-05 DIAGNOSIS — D509 Iron deficiency anemia, unspecified: Secondary | ICD-10-CM | POA: Diagnosis not present

## 2015-11-05 DIAGNOSIS — E119 Type 2 diabetes mellitus without complications: Secondary | ICD-10-CM | POA: Diagnosis not present

## 2015-11-05 DIAGNOSIS — E039 Hypothyroidism, unspecified: Secondary | ICD-10-CM | POA: Diagnosis not present

## 2015-11-07 DIAGNOSIS — F5101 Primary insomnia: Secondary | ICD-10-CM | POA: Diagnosis not present

## 2015-11-07 DIAGNOSIS — E039 Hypothyroidism, unspecified: Secondary | ICD-10-CM | POA: Diagnosis not present

## 2015-11-07 DIAGNOSIS — E119 Type 2 diabetes mellitus without complications: Secondary | ICD-10-CM | POA: Diagnosis not present

## 2015-11-07 DIAGNOSIS — I10 Essential (primary) hypertension: Secondary | ICD-10-CM | POA: Diagnosis not present

## 2016-01-04 DIAGNOSIS — M545 Low back pain: Secondary | ICD-10-CM | POA: Diagnosis not present

## 2016-01-11 ENCOUNTER — Ambulatory Visit (HOSPITAL_COMMUNITY)
Admission: RE | Admit: 2016-01-11 | Discharge: 2016-01-11 | Disposition: A | Discharge status: Home / Self Care | Payer: Medicare Other | Source: Ambulatory Visit | Attending: Adult Health Nurse Practitioner | Admitting: Adult Health Nurse Practitioner

## 2016-01-11 ENCOUNTER — Other Ambulatory Visit (HOSPITAL_COMMUNITY): Payer: Self-pay | Admitting: Adult Health Nurse Practitioner

## 2016-01-11 DIAGNOSIS — M5135 Other intervertebral disc degeneration, thoracolumbar region: Secondary | ICD-10-CM | POA: Insufficient documentation

## 2016-01-11 DIAGNOSIS — M545 Low back pain: Secondary | ICD-10-CM | POA: Diagnosis not present

## 2016-01-11 DIAGNOSIS — M47816 Spondylosis without myelopathy or radiculopathy, lumbar region: Secondary | ICD-10-CM | POA: Diagnosis not present

## 2016-01-11 DIAGNOSIS — M549 Dorsalgia, unspecified: Secondary | ICD-10-CM

## 2016-01-11 DIAGNOSIS — M4186 Other forms of scoliosis, lumbar region: Secondary | ICD-10-CM | POA: Diagnosis not present

## 2016-01-11 DIAGNOSIS — I700 Atherosclerosis of aorta: Secondary | ICD-10-CM | POA: Diagnosis not present

## 2016-03-06 DIAGNOSIS — Z23 Encounter for immunization: Secondary | ICD-10-CM | POA: Diagnosis not present

## 2016-04-16 DIAGNOSIS — H353132 Nonexudative age-related macular degeneration, bilateral, intermediate dry stage: Secondary | ICD-10-CM | POA: Diagnosis not present

## 2016-04-16 DIAGNOSIS — E119 Type 2 diabetes mellitus without complications: Secondary | ICD-10-CM | POA: Diagnosis not present

## 2016-04-16 DIAGNOSIS — H524 Presbyopia: Secondary | ICD-10-CM | POA: Diagnosis not present

## 2016-04-16 DIAGNOSIS — H5203 Hypermetropia, bilateral: Secondary | ICD-10-CM | POA: Diagnosis not present

## 2016-04-16 DIAGNOSIS — H52201 Unspecified astigmatism, right eye: Secondary | ICD-10-CM | POA: Diagnosis not present

## 2016-04-28 ENCOUNTER — Other Ambulatory Visit (HOSPITAL_COMMUNITY): Payer: Self-pay | Admitting: Internal Medicine

## 2016-04-28 ENCOUNTER — Other Ambulatory Visit: Payer: Self-pay | Admitting: Internal Medicine

## 2016-04-28 ENCOUNTER — Other Ambulatory Visit (HOSPITAL_BASED_OUTPATIENT_CLINIC_OR_DEPARTMENT_OTHER): Payer: Self-pay | Admitting: Internal Medicine

## 2016-04-28 DIAGNOSIS — Z1231 Encounter for screening mammogram for malignant neoplasm of breast: Secondary | ICD-10-CM

## 2016-05-09 DIAGNOSIS — E119 Type 2 diabetes mellitus without complications: Secondary | ICD-10-CM | POA: Diagnosis not present

## 2016-05-09 DIAGNOSIS — E039 Hypothyroidism, unspecified: Secondary | ICD-10-CM | POA: Diagnosis not present

## 2016-05-09 DIAGNOSIS — D509 Iron deficiency anemia, unspecified: Secondary | ICD-10-CM | POA: Diagnosis not present

## 2016-05-13 DIAGNOSIS — I10 Essential (primary) hypertension: Secondary | ICD-10-CM | POA: Diagnosis not present

## 2016-05-13 DIAGNOSIS — E119 Type 2 diabetes mellitus without complications: Secondary | ICD-10-CM | POA: Diagnosis not present

## 2016-05-13 DIAGNOSIS — F5101 Primary insomnia: Secondary | ICD-10-CM | POA: Diagnosis not present

## 2016-05-13 DIAGNOSIS — Z0001 Encounter for general adult medical examination with abnormal findings: Secondary | ICD-10-CM | POA: Diagnosis not present

## 2016-05-13 DIAGNOSIS — E039 Hypothyroidism, unspecified: Secondary | ICD-10-CM | POA: Diagnosis not present

## 2016-05-27 DIAGNOSIS — R0602 Shortness of breath: Secondary | ICD-10-CM | POA: Diagnosis not present

## 2016-05-27 DIAGNOSIS — R05 Cough: Secondary | ICD-10-CM | POA: Diagnosis not present

## 2016-05-27 DIAGNOSIS — J209 Acute bronchitis, unspecified: Secondary | ICD-10-CM | POA: Diagnosis not present

## 2016-05-27 DIAGNOSIS — R062 Wheezing: Secondary | ICD-10-CM | POA: Diagnosis not present

## 2016-06-09 ENCOUNTER — Ambulatory Visit (HOSPITAL_COMMUNITY): Payer: Medicare Other

## 2016-06-13 ENCOUNTER — Ambulatory Visit (HOSPITAL_COMMUNITY)
Admission: RE | Admit: 2016-06-13 | Discharge: 2016-06-13 | Disposition: A | Discharge status: Home / Self Care | Payer: Medicare Other | Source: Ambulatory Visit | Attending: Internal Medicine | Admitting: Internal Medicine

## 2016-06-13 DIAGNOSIS — Z1231 Encounter for screening mammogram for malignant neoplasm of breast: Secondary | ICD-10-CM | POA: Insufficient documentation

## 2016-10-22 DIAGNOSIS — H353132 Nonexudative age-related macular degeneration, bilateral, intermediate dry stage: Secondary | ICD-10-CM | POA: Diagnosis not present

## 2016-11-06 DIAGNOSIS — E119 Type 2 diabetes mellitus without complications: Secondary | ICD-10-CM | POA: Diagnosis not present

## 2016-11-06 DIAGNOSIS — D509 Iron deficiency anemia, unspecified: Secondary | ICD-10-CM | POA: Diagnosis not present

## 2016-11-06 DIAGNOSIS — E039 Hypothyroidism, unspecified: Secondary | ICD-10-CM | POA: Diagnosis not present

## 2016-11-11 DIAGNOSIS — E119 Type 2 diabetes mellitus without complications: Secondary | ICD-10-CM | POA: Diagnosis not present

## 2016-11-11 DIAGNOSIS — I10 Essential (primary) hypertension: Secondary | ICD-10-CM | POA: Diagnosis not present

## 2016-11-11 DIAGNOSIS — E039 Hypothyroidism, unspecified: Secondary | ICD-10-CM | POA: Diagnosis not present

## 2016-11-11 DIAGNOSIS — F5101 Primary insomnia: Secondary | ICD-10-CM | POA: Diagnosis not present

## 2016-11-26 ENCOUNTER — Encounter: Payer: Self-pay | Admitting: Orthopaedic Surgery

## 2016-12-15 DIAGNOSIS — Z23 Encounter for immunization: Secondary | ICD-10-CM | POA: Diagnosis not present

## 2016-12-31 ENCOUNTER — Encounter: Payer: Self-pay | Admitting: Orthopaedic Surgery

## 2016-12-31 ENCOUNTER — Ambulatory Visit (INDEPENDENT_AMBULATORY_CARE_PROVIDER_SITE_OTHER): Payer: Medicare Other | Admitting: Orthopaedic Surgery

## 2016-12-31 ENCOUNTER — Ambulatory Visit (INDEPENDENT_AMBULATORY_CARE_PROVIDER_SITE_OTHER): Payer: Medicare Other

## 2016-12-31 VITALS — BP 129/71 | HR 114 | Temp 97.1°F | Ht 67.0 in | Wt 174.0 lb

## 2016-12-31 DIAGNOSIS — M25561 Pain in right knee: Secondary | ICD-10-CM

## 2016-12-31 DIAGNOSIS — M25562 Pain in left knee: Secondary | ICD-10-CM | POA: Diagnosis not present

## 2016-12-31 DIAGNOSIS — G8929 Other chronic pain: Secondary | ICD-10-CM | POA: Diagnosis not present

## 2016-12-31 NOTE — Progress Notes (Signed)
Patient Natalie Long, female DOB:1935/07/11, 81 y.o. YNW:[id number]  Chief Complaint  Patient presents with  . Follow-up    bilateral knee pain    HPI  Natalie Long is a 81 y.o. female who has bilateral knee pain.  She saw Dr. Romeo Apple in February of 2017 with knee pain on the left.  She has no new trauma.  She has swelling, popping and pain.  She has no giving way.  She is not better with rest, heat, ice and rubs.  She has no redness or numbness.   HPI  Body mass index is 27.25 kg/m.  ROS  Review of Systems  Constitutional: Positive for fatigue.  HENT: Positive for tinnitus.   Eyes: Positive for visual disturbance.  Respiratory: Positive for shortness of breath.   Cardiovascular: Positive for leg swelling.  Musculoskeletal: Positive for arthralgias, back pain, gait problem, joint swelling and myalgias.  Neurological: Positive for weakness.  Psychiatric/Behavioral: The patient is nervous/anxious.   All other systems reviewed and are negative.   Past Medical History:  Diagnosis Date  . Anxiety   . Arthritis   . Back pain   . Constipation   . Cystocele   . Depression   . Diabetes mellitus without complication (HCC)   . Hyperlipidemia   . Hypertension   . Rectocele   . Thyroid disease     Past Surgical History:  Procedure Laterality Date  . ABDOMINAL HYSTERECTOMY    . CATARACT EXTRACTION  2004 and 2008  . COLONOSCOPY  2004   Dr Rayann Heman  . COLONOSCOPY N/A 08/12/2012   Procedure: COLONOSCOPY;  Surgeon: Corbin Ade, MD;  Location: [facility];  Service: Endoscopy;  Laterality: N/A;  11:30  . ESOPHAGOGASTRODUODENOSCOPY  01/01/2009   [id number] hiatal hernia.Normal esophagus.Antral nodule, status post biopsy    Family History  Problem Relation Age of Onset  . Osteoporosis Mother   . Heart disease Father   . Hyperlipidemia Father   . Hypertension Father   . Depression Sister   . Cancer Sister        metastatic  . Diabetes Sister   .  Colon cancer Sister     Social History Social History  Substance Use Topics  . Smoking status: Never Smoker  . Smokeless tobacco: Never Used  . Alcohol use No    Allergies  Allergen Reactions  . Codeine Nausea Only    Current Outpatient Prescriptions  Medication Sig Dispense Refill  . amLODipine-olmesartan (AZOR) 5-20 MG tablet Take 1 tablet by mouth daily.     Marland Kitchen aspirin 81 MG tablet Take 81 mg by mouth daily.    . Calcium Carbonate-Vitamin D (CALCIUM 600+D) 600-400 MG-UNIT per tablet Take 2 tablets by mouth daily.    . diclofenac sodium (VOLTAREN) 1 % GEL Apply topically 4 (four) times daily.    . hydrochlorothiazide (HYDRODIURIL) 25 MG tablet Take 1 tablet (25 mg total) by mouth daily. 90 tablet 1  . HYDROcodone-acetaminophen (NORCO/VICODIN) 5-325 MG tablet Take 1-2 tablets by mouth every 6 (six) hours as needed for moderate pain.     Marland Kitchen levothyroxine (SYNTHROID, LEVOTHROID) 75 MCG tablet Take 75 mcg by mouth daily before breakfast.    . metFORMIN (GLUCOPHAGE) 500 MG tablet Take 500 mg by mouth 2 (two) times daily with a meal.    . methocarbamol (ROBAXIN) 500 MG tablet Take 1 tablet (500 mg total) by mouth every 6 (six) hours as needed for muscle spasms. 56 tablet 1  . Multiple Vitamin (MULTIVITAMIN)  capsule Take 1 capsule by mouth daily.    . Multiple Vitamins-Minerals (VISION-VITE PRESERVE PO) Take by mouth.    Marland Kitchen. omeprazole (PRILOSEC) 20 MG capsule Take 20 mg by mouth daily.     . traMADol (ULTRAM) 50 MG tablet Take 50 mg by mouth daily as needed for moderate pain or severe pain.      No current facility-administered medications for this visit.      Physical Exam  Blood pressure 129/71, pulse (!) 114, temperature (!) 97.1 F (36.2 C), height 5\' 7"  (1.702 m), weight 174 lb (78.9 kg).  Constitutional: overall normal hygiene, normal nutrition, well developed, normal grooming, normal body habitus. Assistive device:none  Musculoskeletal: gait and station Limp right, muscle  tone and strength are normal, no tremors or atrophy is present.  .  Neurological: coordination overall normal.  Deep tendon reflex/nerve stretch intact.  Sensation normal.  Cranial nerves II-XII intact.   Skin:   Normal overall no scars, lesions, ulcers or rashes. No psoriasis.  Psychiatric: Alert and oriented x 3.  Recent memory intact, remote memory unclear.  Normal mood and affect. Well groomed.  Good eye contact.  Cardiovascular: overall no swelling, no varicosities, no edema bilaterally, normal temperatures of the legs and arms, no clubbing, cyanosis and good capillary refill.  Lymphatic: palpation is normal.  The bilateral lower extremity is examined:  Inspection:  Thigh:  Non-tender and no defects  Knee has swelling 1+ effusion.                        Joint tenderness is present                        Patient is tender over the medial joint line  Lower Leg:  Has normal appearance and no tenderness or defects  Ankle:  Non-tender and no defects  Foot:  Non-tender and no defects Range of Motion:  Knee:  Range of motion is: 0-100 right, 0-105 left                        Crepitus is  present  Ankle:  Range of motion is normal. Strength and Tone:  The bilateral lower extremity has normal strength and tone. Stability:  Knee:  The knee is stable.  Ankle:  The ankle is stable.  X-rays were done of both knees, reported separately.  The patient has been educated about the nature of the problem(s) and counseled on treatment options.  The patient appeared to understand what I have discussed and is in agreement with it.  Encounter Diagnoses  Name Primary?  . Chronic pain of right knee Yes  . Chronic pain of left knee    PROCEDURE NOTE:  The patient requests injections of the right knee , verbal consent was obtained.  The right knee was prepped appropriately after time out was performed.   Sterile technique was observed and injection of 1 cc of Depo-Medrol 40 mg with several  cc's of plain xylocaine. Anesthesia was provided by ethyl chloride and a 20-gauge needle was used to inject the knee area. The injection was tolerated well.  A band aid dressing was applied.  The patient was advised to apply ice later today and tomorrow to the injection sight as needed.  PROCEDURE NOTE:  The patient requests injections of the left knee , verbal consent was obtained.  The left knee was prepped appropriately after time out was  performed.   Sterile technique was observed and injection of 1 cc of Depo-Medrol 40 mg with several cc's of plain xylocaine. Anesthesia was provided by ethyl chloride and a 20-gauge needle was used to inject the knee area. The injection was tolerated well.  A band aid dressing was applied.  The patient was advised to apply ice later today and tomorrow to the injection sight as needed.   PLAN Call if any problems.  Precautions discussed.  Continue current medications.   Return to clinic 2 weeks   Electronically Signed Darreld McleanWayne Vali Capano, MD 8/1/20183:37 PM

## 2017-01-06 ENCOUNTER — Ambulatory Visit: Payer: Medicare Other | Admitting: Orthopedic Surgery

## 2017-01-13 IMAGING — DX DG CHEST 2V
2 series · 2 of 2 positions shown · non-contrast
Comparison: PA and lateral chest 02/24/2005.

CLINICAL DATA: Chest pain, weakness and shortness of breath today.
Initial encounter.

EXAM:
CHEST  2 VIEW

[chest pa]
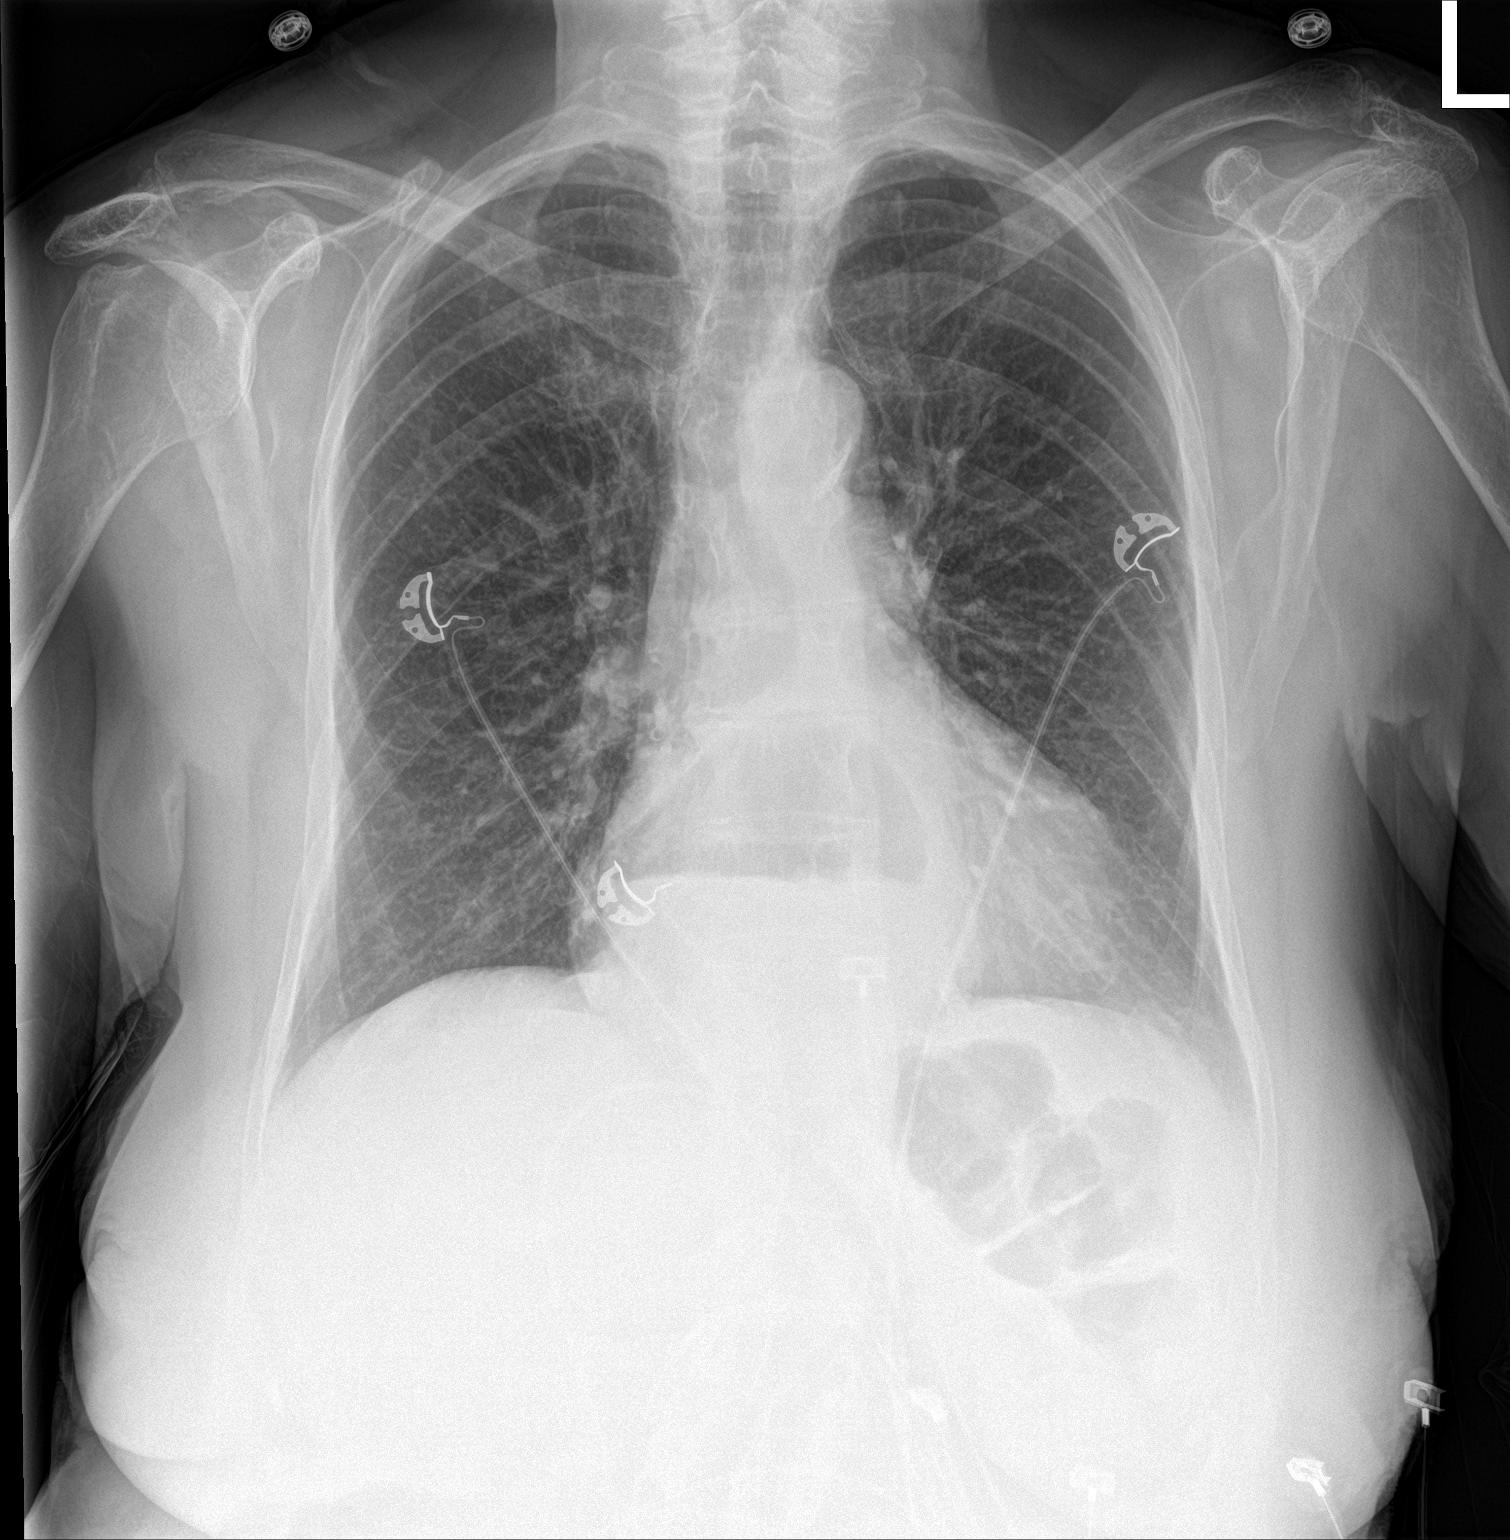

[chest lat]
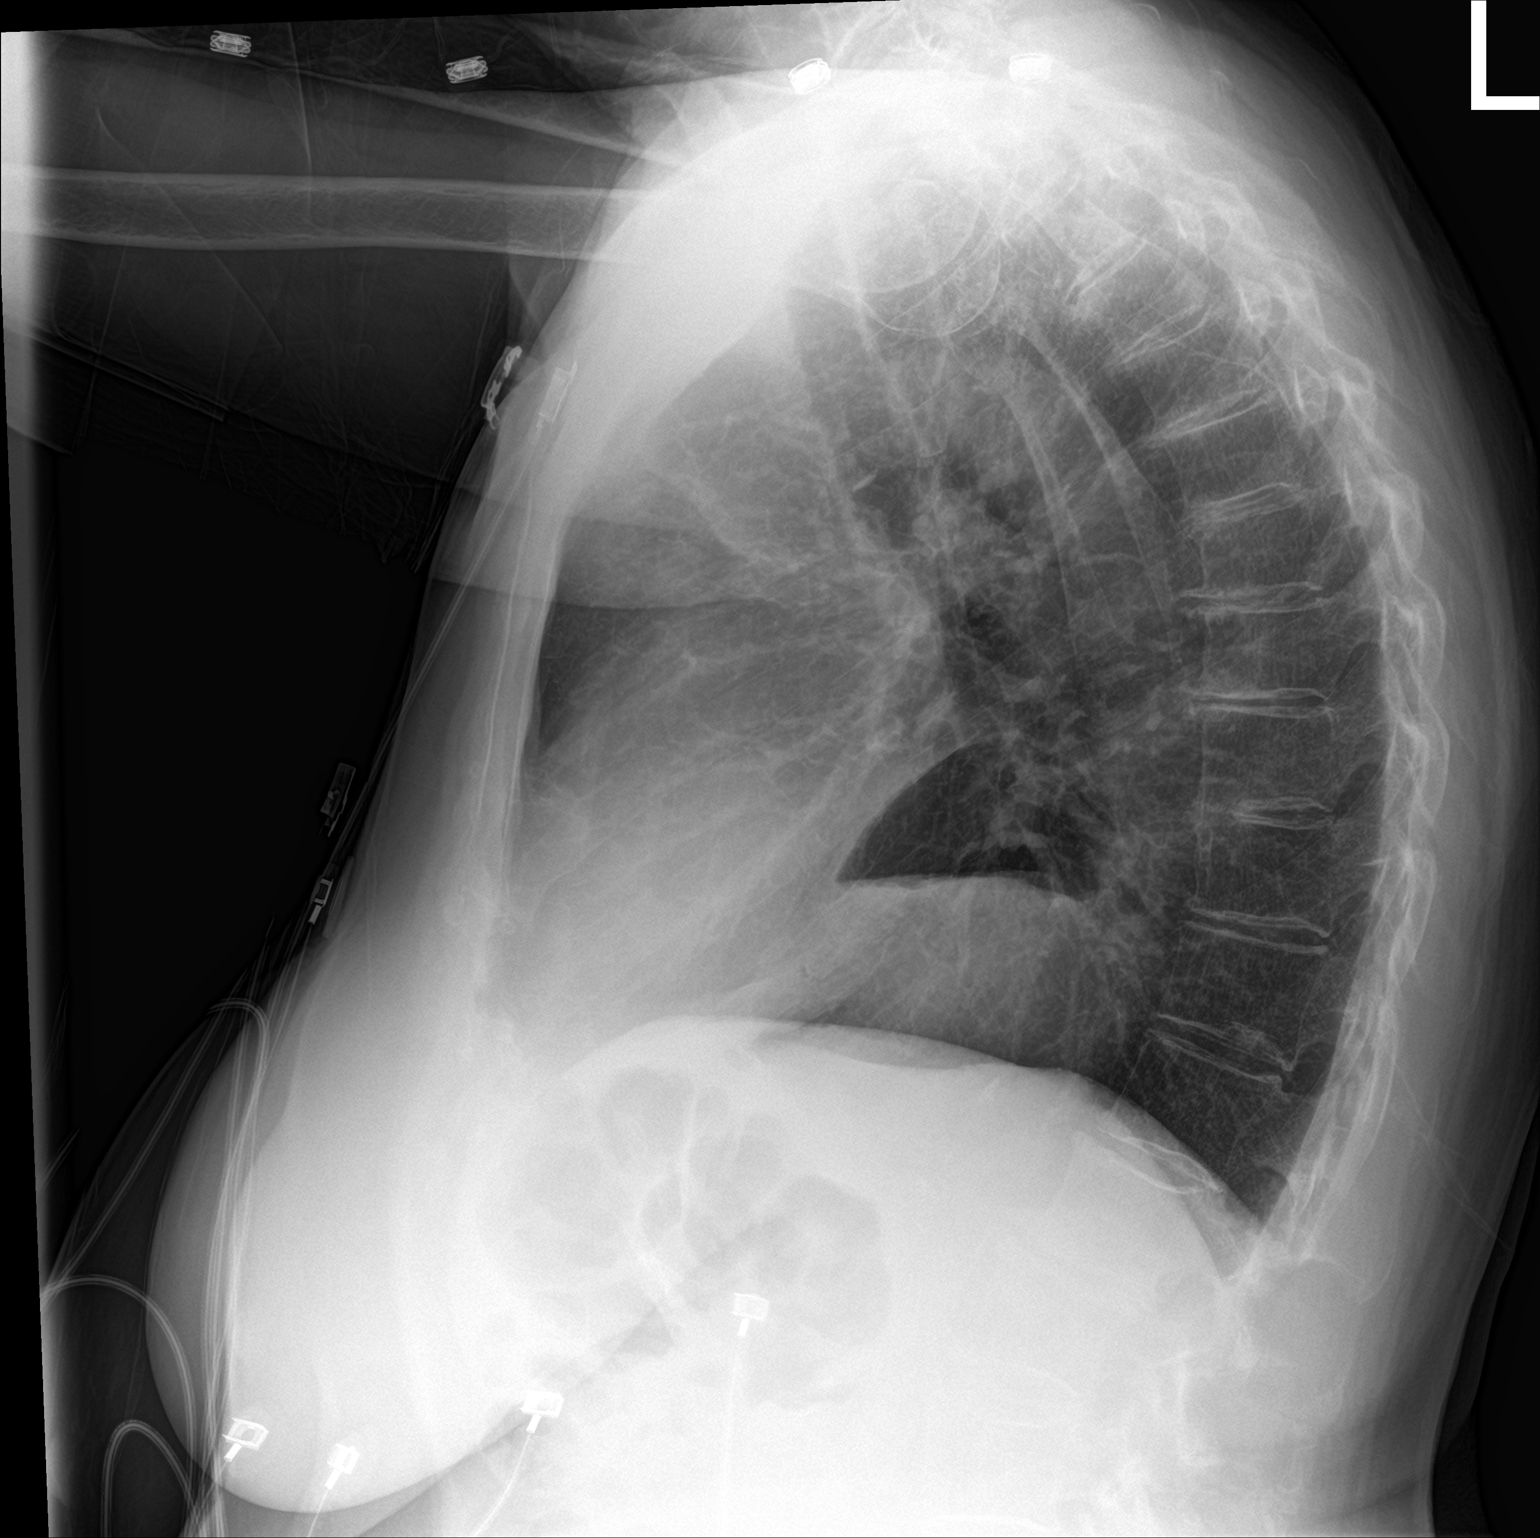

[2 of 2 positions shown; findings below may reference images not displayed]

FINDINGS: The lungs are clear. Heart size is normal. No pneumothorax or
pleural effusion. Moderate to large hiatal hernia is noted.
IMPRESSION: No acute disease.

Hiatal hernia.

## 2017-01-13 IMAGING — DX DG KNEE COMPLETE 4+V*L*
5 series · 5 of 5 positions shown · non-contrast
Comparison: Knee radiograph 10/09/2003.

CLINICAL DATA: Patient with increased knee pain and swelling.
Weakness. Initial encounter.

EXAM:
LEFT KNEE - COMPLETE 4+ VIEW

[knee ap]
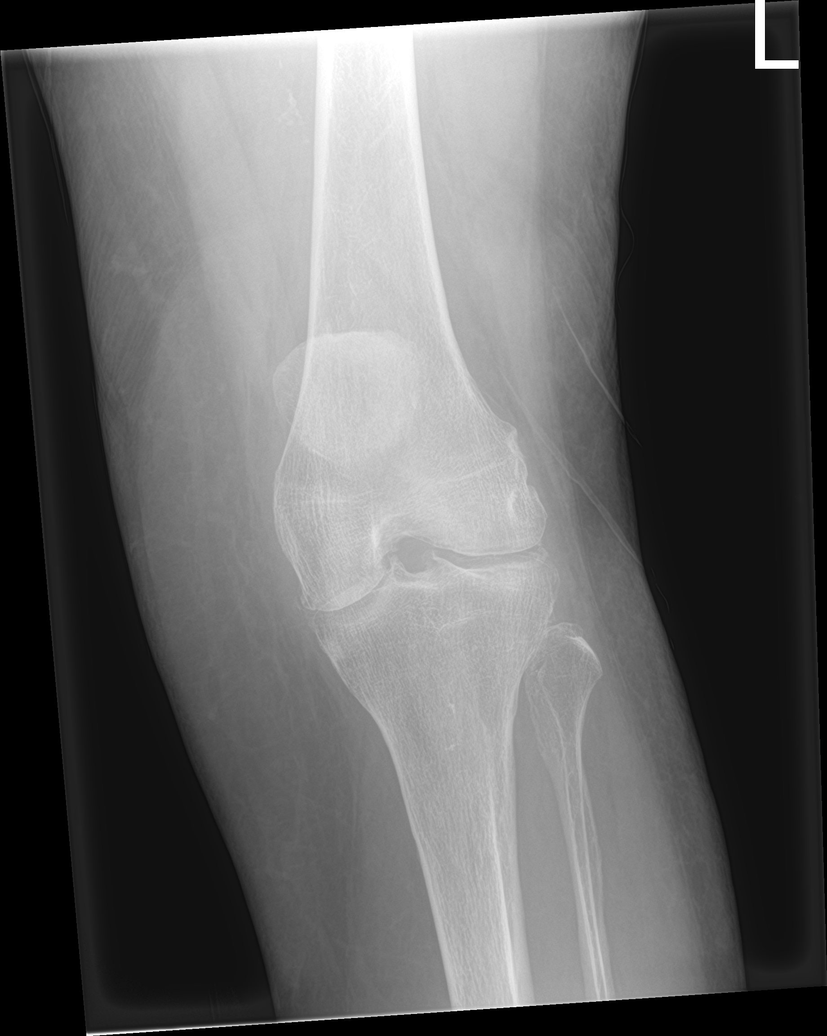

[tunnel]
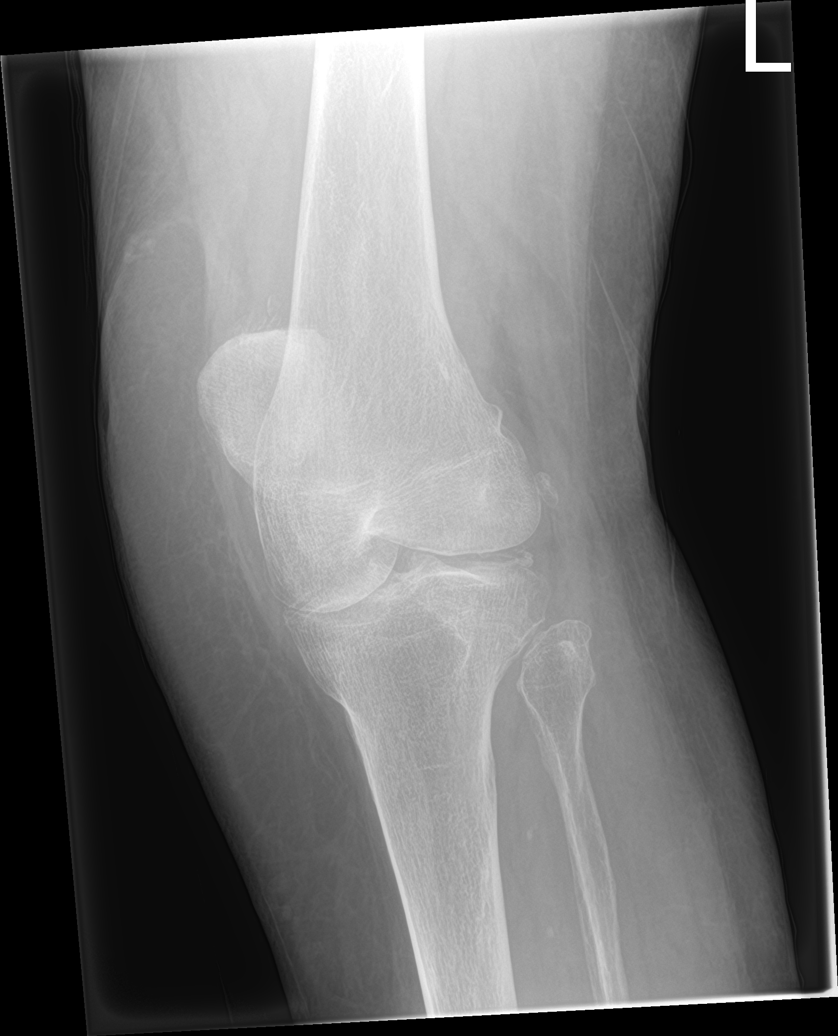

[knee lat (1 of 2)]
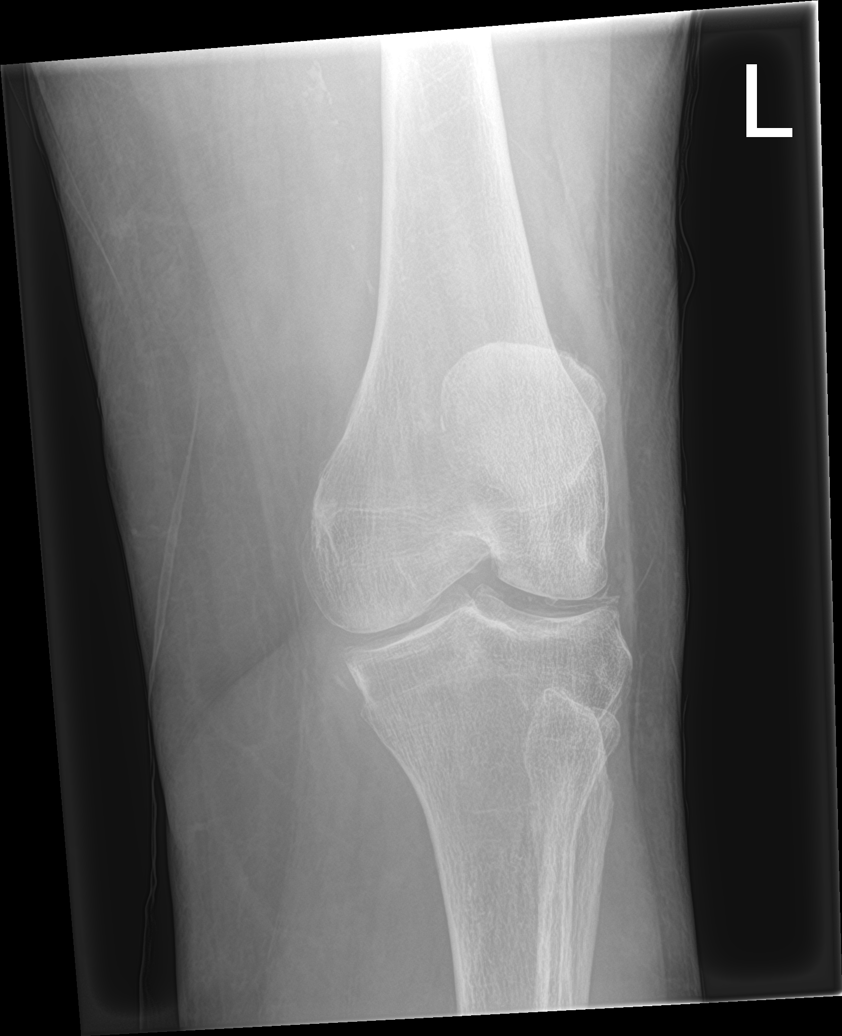

[knee sunrise]
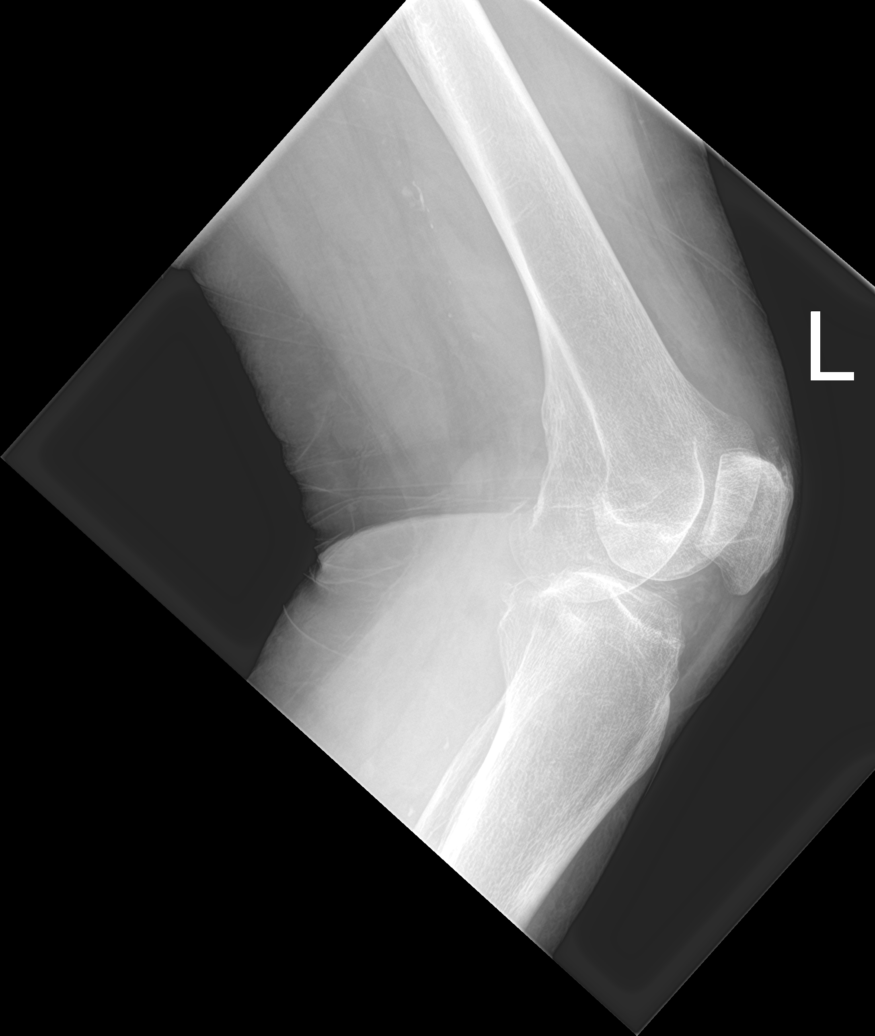

[knee lat (2 of 2)]
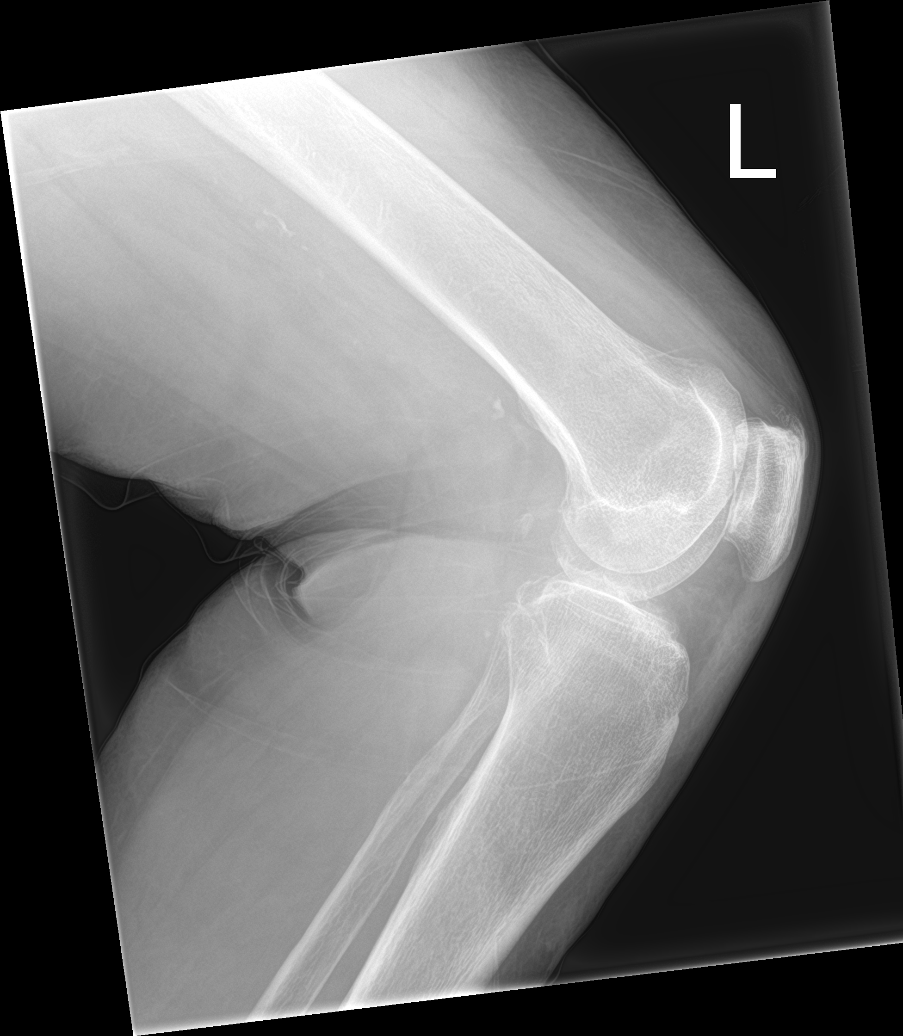

[5 of 5 positions shown; findings below may reference images not displayed]

FINDINGS: Normal anatomic alignment. No evidence for acute fracture or
dislocation. Mild tricompartmental osteoarthritis.
Chondrocalcinosis. No joint effusion. Vascular calcifications.
Healed proximal fibular fracture.
IMPRESSION: No acute osseous abnormality.

Degenerative changes.

## 2017-01-14 ENCOUNTER — Ambulatory Visit (INDEPENDENT_AMBULATORY_CARE_PROVIDER_SITE_OTHER): Payer: Medicare Other | Admitting: Orthopaedic Surgery

## 2017-01-14 ENCOUNTER — Encounter: Payer: Self-pay | Admitting: Orthopaedic Surgery

## 2017-01-14 VITALS — BP 123/71 | HR 101 | Temp 97.8°F | Ht 67.0 in | Wt 172.0 lb

## 2017-01-14 DIAGNOSIS — G8929 Other chronic pain: Secondary | ICD-10-CM

## 2017-01-14 DIAGNOSIS — M25562 Pain in left knee: Secondary | ICD-10-CM

## 2017-01-14 NOTE — Progress Notes (Signed)
Patient Natalie Long, female DOB:1935/06/19, 81 y.o. HYQ:[id number]  Chief Complaint  Patient presents with  . Follow-up    bilat knee pain    HPI  Natalie Long is a 81 y.o. female who has bilateral knee pain.  She is better after the injection last week.  She is walking better and has less swelling.  She still has popping. HPI  Body mass index is 26.94 kg/m.  ROS  Review of Systems  Constitutional: Positive for fatigue.  HENT: Positive for tinnitus.   Eyes: Positive for visual disturbance.  Respiratory: Positive for shortness of breath.   Cardiovascular: Positive for leg swelling.  Musculoskeletal: Positive for arthralgias, back pain, gait problem, joint swelling and myalgias.  Neurological: Positive for weakness.  Psychiatric/Behavioral: The patient is nervous/anxious.   All other systems reviewed and are negative.   Past Medical History:  Diagnosis Date  . Anxiety   . Arthritis   . Back pain   . Constipation   . Cystocele   . Depression   . Diabetes mellitus without complication (HCC)   . Hyperlipidemia   . Hypertension   . Rectocele   . Thyroid disease     Past Surgical History:  Procedure Laterality Date  . ABDOMINAL HYSTERECTOMY    . CATARACT EXTRACTION  2004 and 2008  . COLONOSCOPY  2004   Dr Rayann Hemanehman-normal  . COLONOSCOPY N/A 08/12/2012   Procedure: COLONOSCOPY;  Surgeon: Corbin Adeobert M Rourk, MD;  Location: [facility];  Service: Endoscopy;  Laterality: N/A;  11:30  . ESOPHAGOGASTRODUODENOSCOPY  01/01/2009   [id number]:Moderate-sized hiatal hernia.Normal esophagus.Antral nodule, status post biopsy    Family History  Problem Relation Age of Onset  . Osteoporosis Mother   . Heart disease Father   . Hyperlipidemia Father   . Hypertension Father   . Depression Sister   . Cancer Sister        metastatic  . Diabetes Sister   . Colon cancer Sister     Social History Social History  Substance Use Topics  . Smoking status: Never Smoker  . Smokeless  tobacco: Never Used  . Alcohol use No    Allergies  Allergen Reactions  . Codeine Nausea Only    Current Outpatient Prescriptions  Medication Sig Dispense Refill  . amLODipine-olmesartan (AZOR) 5-20 MG tablet Take 1 tablet by mouth daily.     Marland Kitchen. aspirin 81 MG tablet Take 81 mg by mouth daily.    . Calcium Carbonate-Vitamin D (CALCIUM 600+D) 600-400 MG-UNIT per tablet Take 2 tablets by mouth daily.    . diclofenac sodium (VOLTAREN) 1 % GEL Apply topically 4 (four) times daily.    . hydrochlorothiazide (HYDRODIURIL) 25 MG tablet Take 1 tablet (25 mg total) by mouth daily. 90 tablet 1  . HYDROcodone-acetaminophen (NORCO/VICODIN) 5-325 MG tablet Take 1-2 tablets by mouth every 6 (six) hours as needed for moderate pain.     Marland Kitchen. levothyroxine (SYNTHROID, LEVOTHROID) 75 MCG tablet Take 75 mcg by mouth daily before breakfast.    . metFORMIN (GLUCOPHAGE) 500 MG tablet Take 500 mg by mouth 2 (two) times daily with a meal.    . methocarbamol (ROBAXIN) 500 MG tablet Take 1 tablet (500 mg total) by mouth every 6 (six) hours as needed for muscle spasms. 56 tablet 1  . Multiple Vitamin (MULTIVITAMIN) capsule Take 1 capsule by mouth daily.    . Multiple Vitamins-Minerals (VISION-VITE PRESERVE PO) Take by mouth.    Marland Kitchen. omeprazole (PRILOSEC) 20 MG capsule Take 20 mg  by mouth daily.     . traMADol (ULTRAM) 50 MG tablet Take 50 mg by mouth daily as needed for moderate pain or severe pain.      No current facility-administered medications for this visit.      Physical Exam  Blood pressure 123/71, pulse (!) 101, temperature 97.8 F (36.6 C), height 5\' 7"  (1.702 m), weight 172 lb (78 kg).  Constitutional: overall normal hygiene, normal nutrition, well developed, normal grooming, normal body habitus. Assistive device:none  Musculoskeletal: gait and station Limp none, muscle tone and strength are normal, no tremors or atrophy is present.  .  Neurological: coordination overall normal.  Deep tendon  reflex/nerve stretch intact.  Sensation normal.  Cranial nerves II-XII intact.   Skin:   Normal overall no scars, lesions, ulcers or rashes. No psoriasis.  Psychiatric: Alert and oriented x 3.  Recent memory intact, remote memory unclear.  Normal mood and affect. Well groomed.  Good eye contact.  Cardiovascular: overall no swelling, no varicosities, no edema bilaterally, normal temperatures of the legs and arms, no clubbing, cyanosis and good capillary refill.  Lymphatic: palpation is normal.  Both knees are tender with ROM 0 to 100 right and 0 to 105 left.  Both have crepitus but minimal effusion.  NV intact.  Gait is good.  Knees are stable.  The patient has been educated about the nature of the problem(s) and counseled on treatment options.  The patient appeared to understand what I have discussed and is in agreement with it.  Encounter Diagnosis  Name Primary?  . Chronic pain of left knee Yes    PLAN Call if any problems.  Precautions discussed.  Continue current medications.   Return to clinic 2 weeks   Electronically Signed Darreld Mclean, MD 8/15/20183:13 PM

## 2017-01-15 DIAGNOSIS — L84 Corns and callosities: Secondary | ICD-10-CM | POA: Diagnosis not present

## 2017-01-15 DIAGNOSIS — E119 Type 2 diabetes mellitus without complications: Secondary | ICD-10-CM | POA: Diagnosis not present

## 2017-01-28 ENCOUNTER — Ambulatory Visit (INDEPENDENT_AMBULATORY_CARE_PROVIDER_SITE_OTHER): Payer: Medicare Other | Admitting: Orthopaedic Surgery

## 2017-01-28 VITALS — BP 130/75 | HR 96 | Temp 98.7°F | Ht 67.0 in | Wt 176.0 lb

## 2017-01-28 DIAGNOSIS — G8929 Other chronic pain: Secondary | ICD-10-CM

## 2017-01-28 DIAGNOSIS — M8580 Other specified disorders of bone density and structure, unspecified site: Secondary | ICD-10-CM

## 2017-01-28 DIAGNOSIS — M25562 Pain in left knee: Secondary | ICD-10-CM | POA: Diagnosis not present

## 2017-01-28 DIAGNOSIS — M25561 Pain in right knee: Secondary | ICD-10-CM | POA: Diagnosis not present

## 2017-01-28 NOTE — Progress Notes (Signed)
CC: Both of my knees are hurting. I would like an injection in both knees.  The patient has had chronic pain and tenderness of both knees for some time.  Injections help.  There is no locking or giving way of the knee.  There is no new trauma. There is no redness or signs of infections.  The knees have a mild effusion and some crepitus.  There is no redness or signs of recent trauma.  Right knee ROM is 0-100 and left knee ROM is 0-110.  Impression:  Chronic pain of the both knees  Return:  1 month  PROCEDURE NOTE:  The patient requests injections of both knees, verbal consent was obtained.  The left and right knee were individually prepped appropriately after time out was performed.   Sterile technique was observed and injection of 1 cc of Depo-Medrol 40 mg with several cc's of plain xylocaine. Anesthesia was provided by ethyl chloride and a 20-gauge needle was used to inject each knee area. The injections were tolerated well.  A band aid dressing was applied.  The patient was advised to apply ice later today and tomorrow to the injection sight as needed.    I will get a bone density on her.  Return in one month.  Call if any problem.  Precautions discussed.   Electronically Signed Darreld McleanWayne Tyla Burgner, MD 8/29/20183:28 PM

## 2017-01-30 ENCOUNTER — Ambulatory Visit (HOSPITAL_COMMUNITY)
Admission: RE | Admit: 2017-01-30 | Discharge: 2017-01-30 | Disposition: A | Discharge status: Home / Self Care | Payer: Medicare Other | Source: Ambulatory Visit | Attending: Orthopaedic Surgery | Admitting: Orthopaedic Surgery

## 2017-01-30 DIAGNOSIS — M818 Other osteoporosis without current pathological fracture: Secondary | ICD-10-CM | POA: Diagnosis not present

## 2017-01-30 DIAGNOSIS — M8580 Other specified disorders of bone density and structure, unspecified site: Secondary | ICD-10-CM

## 2017-01-30 DIAGNOSIS — Z780 Asymptomatic menopausal state: Secondary | ICD-10-CM | POA: Diagnosis not present

## 2017-01-30 DIAGNOSIS — M810 Age-related osteoporosis without current pathological fracture: Secondary | ICD-10-CM | POA: Diagnosis not present

## 2017-03-02 DIAGNOSIS — Z23 Encounter for immunization: Secondary | ICD-10-CM | POA: Diagnosis not present

## 2017-03-04 ENCOUNTER — Encounter: Payer: Self-pay | Admitting: Orthopaedic Surgery

## 2017-03-04 ENCOUNTER — Ambulatory Visit (INDEPENDENT_AMBULATORY_CARE_PROVIDER_SITE_OTHER): Payer: Medicare Other | Admitting: Orthopaedic Surgery

## 2017-03-04 VITALS — BP 141/72 | HR 93 | Temp 98.2°F | Ht 67.0 in | Wt 178.0 lb

## 2017-03-04 DIAGNOSIS — M25562 Pain in left knee: Secondary | ICD-10-CM | POA: Diagnosis not present

## 2017-03-04 DIAGNOSIS — M25561 Pain in right knee: Secondary | ICD-10-CM | POA: Diagnosis not present

## 2017-03-04 DIAGNOSIS — G8929 Other chronic pain: Secondary | ICD-10-CM | POA: Diagnosis not present

## 2017-03-04 NOTE — Progress Notes (Signed)
Patient Natalie Long, female DOB:05/09/1936, 81 y.o. AVW:[id number]  Chief Complaint  Patient presents with  . Knee Pain    bilateral    HPI  Natalie Long is a 81 y.o. female who has bilateral knee pain. She has no giving way.  She has swelling and popping and more pain at night.  She is trying to be as active as she can.  The injections last time helped.. She has no new trauma. HPI  Body mass index is 27.88 kg/m.  ROS  Review of Systems  Constitutional: Positive for fatigue.  HENT: Positive for tinnitus.   Eyes: Positive for visual disturbance.  Respiratory: Positive for shortness of breath.   Cardiovascular: Positive for leg swelling.  Musculoskeletal: Positive for arthralgias, back pain, gait problem, joint swelling and myalgias.  Neurological: Positive for weakness.  Psychiatric/Behavioral: The patient is nervous/anxious.   All other systems reviewed and are negative.   Past Medical History:  Diagnosis Date  . Anxiety   . Arthritis   . Back pain   . Constipation   . Cystocele   . Depression   . Diabetes mellitus without complication (HCC)   . Hyperlipidemia   . Hypertension   . Rectocele   . Thyroid disease     Past Surgical History:  Procedure Laterality Date  . ABDOMINAL HYSTERECTOMY    . CATARACT EXTRACTION  2004 and 2008  . COLONOSCOPY  2004   Dr Rayann Heman  . COLONOSCOPY N/A 08/12/2012   Procedure: COLONOSCOPY;  Surgeon: Corbin Ade, MD;  Location: [facility];  Service: Endoscopy;  Laterality: N/A;  11:30  . ESOPHAGOGASTRODUODENOSCOPY  01/01/2009   [id number] hiatal hernia.Normal esophagus.Antral nodule, status post biopsy    Family History  Problem Relation Age of Onset  . Osteoporosis Mother   . Heart disease Father   . Hyperlipidemia Father   . Hypertension Father   . Depression Sister   . Cancer Sister        metastatic  . Diabetes Sister   . Colon cancer Sister     Social History Social History  Substance Use  Topics  . Smoking status: Never Smoker  . Smokeless tobacco: Never Used  . Alcohol use No    Allergies  Allergen Reactions  . Codeine Nausea Only    Current Outpatient Prescriptions  Medication Sig Dispense Refill  . amLODipine-olmesartan (AZOR) 5-20 MG tablet Take 1 tablet by mouth daily.     Marland Kitchen aspirin 81 MG tablet Take 81 mg by mouth daily.    . Calcium Carbonate-Vitamin D (CALCIUM 600+D) 600-400 MG-UNIT per tablet Take 2 tablets by mouth daily.    . diclofenac sodium (VOLTAREN) 1 % GEL Apply topically 4 (four) times daily.    . hydrochlorothiazide (HYDRODIURIL) 25 MG tablet Take 1 tablet (25 mg total) by mouth daily. 90 tablet 1  . HYDROcodone-acetaminophen (NORCO/VICODIN) 5-325 MG tablet Take 1-2 tablets by mouth every 6 (six) hours as needed for moderate pain.     Marland Kitchen levothyroxine (SYNTHROID, LEVOTHROID) 75 MCG tablet Take 75 mcg by mouth daily before breakfast.    . metFORMIN (GLUCOPHAGE) 500 MG tablet Take 500 mg by mouth 2 (two) times daily with a meal.    . methocarbamol (ROBAXIN) 500 MG tablet Take 1 tablet (500 mg total) by mouth every 6 (six) hours as needed for muscle spasms. 56 tablet 1  . Multiple Vitamin (MULTIVITAMIN) capsule Take 1 capsule by mouth daily.    . Multiple Vitamins-Minerals (VISION-VITE PRESERVE PO) Take  by mouth.    Marland Kitchen omeprazole (PRILOSEC) 20 MG capsule Take 20 mg by mouth daily.     . traMADol (ULTRAM) 50 MG tablet Take 50 mg by mouth daily as needed for moderate pain or severe pain.      No current facility-administered medications for this visit.      Physical Exam  Blood pressure (!) 141/72, pulse 93, temperature 98.2 F (36.8 C), height  (1.702 m), weight 178 lb (80.7 kg).  Constitutional: overall normal hygiene, normal nutrition, well developed, normal grooming, normal body habitus. Assistive device:none  Musculoskeletal: gait and station Limp left, muscle tone and strength are normal, no tremors or atrophy is present.  .   Neurological: coordination overall normal.  Deep tendon reflex/nerve stretch intact.  Sensation normal.  Cranial nerves II-XII intact.   Skin:   Normal overall no scars, lesions, ulcers or rashes. No psoriasis.  Psychiatric: Alert and oriented x 3.  Recent memory intact, remote memory unclear.  Normal mood and affect. Well groomed.  Good eye contact.  Cardiovascular: overall no swelling, no varicosities, no edema bilaterally, normal temperatures of the legs and arms, no clubbing, cyanosis and good capillary refill.  Lymphatic: palpation is normal.  All other systems reviewed and are negative   The bilateral lower extremity is examined:  Inspection:  Thigh:  Non-tender and no defects  Knee has swelling 1+ effusion.                        Joint tenderness is present                        Patient is tender over the medial joint line  Lower Leg:  Has normal appearance and no tenderness or defects  Ankle:  Non-tender and no defects  Foot:  Non-tender and no defects Range of Motion:  Knee:  Range of motion is: 0-105 right, 0-100 left                        Crepitus is  present  Ankle:  Range of motion is normal. Strength and Tone:  The bilateral lower extremity has normal strength and tone. Stability:  Knee:  The knee is stable.  Ankle:  The ankle is stable.   The patient has been educated about the nature of the problem(s) and counseled on treatment options.  The patient appeared to understand what I have discussed and is in agreement with it.  Encounter Diagnoses  Name Primary?  . Chronic pain of left knee Yes  . Chronic pain of right knee     PLAN Call if any problems.  Precautions discussed.  Continue current medications.   Return to clinic 6 weeks   Electronically Signed Darreld Mclean, MD 10/3/20182:59 PM

## 2017-03-11 DIAGNOSIS — E119 Type 2 diabetes mellitus without complications: Secondary | ICD-10-CM | POA: Diagnosis not present

## 2017-03-11 DIAGNOSIS — N390 Urinary tract infection, site not specified: Secondary | ICD-10-CM | POA: Diagnosis not present

## 2017-03-25 DIAGNOSIS — I10 Essential (primary) hypertension: Secondary | ICD-10-CM | POA: Diagnosis not present

## 2017-03-25 DIAGNOSIS — E119 Type 2 diabetes mellitus without complications: Secondary | ICD-10-CM | POA: Diagnosis not present

## 2017-04-15 ENCOUNTER — Ambulatory Visit: Payer: Medicare Other | Admitting: Orthopaedic Surgery

## 2017-04-28 DIAGNOSIS — R197 Diarrhea, unspecified: Secondary | ICD-10-CM | POA: Diagnosis not present

## 2017-05-04 ENCOUNTER — Other Ambulatory Visit (HOSPITAL_COMMUNITY): Payer: Self-pay | Admitting: Internal Medicine

## 2017-05-04 DIAGNOSIS — Z1231 Encounter for screening mammogram for malignant neoplasm of breast: Secondary | ICD-10-CM

## 2017-05-06 DIAGNOSIS — H524 Presbyopia: Secondary | ICD-10-CM | POA: Diagnosis not present

## 2017-05-06 DIAGNOSIS — H5203 Hypermetropia, bilateral: Secondary | ICD-10-CM | POA: Diagnosis not present

## 2017-05-06 DIAGNOSIS — H353132 Nonexudative age-related macular degeneration, bilateral, intermediate dry stage: Secondary | ICD-10-CM | POA: Diagnosis not present

## 2017-05-06 DIAGNOSIS — E119 Type 2 diabetes mellitus without complications: Secondary | ICD-10-CM | POA: Diagnosis not present

## 2017-05-06 DIAGNOSIS — H52201 Unspecified astigmatism, right eye: Secondary | ICD-10-CM | POA: Diagnosis not present

## 2017-05-24 DIAGNOSIS — I10 Essential (primary) hypertension: Secondary | ICD-10-CM | POA: Diagnosis not present

## 2017-05-24 DIAGNOSIS — J209 Acute bronchitis, unspecified: Secondary | ICD-10-CM | POA: Diagnosis not present

## 2017-05-29 DIAGNOSIS — I10 Essential (primary) hypertension: Secondary | ICD-10-CM | POA: Diagnosis not present

## 2017-05-29 DIAGNOSIS — G9009 Other idiopathic peripheral autonomic neuropathy: Secondary | ICD-10-CM | POA: Diagnosis not present

## 2017-05-29 DIAGNOSIS — E119 Type 2 diabetes mellitus without complications: Secondary | ICD-10-CM | POA: Diagnosis not present

## 2017-05-29 DIAGNOSIS — Z0001 Encounter for general adult medical examination with abnormal findings: Secondary | ICD-10-CM | POA: Diagnosis not present

## 2017-05-29 DIAGNOSIS — D509 Iron deficiency anemia, unspecified: Secondary | ICD-10-CM | POA: Diagnosis not present

## 2017-05-29 DIAGNOSIS — E039 Hypothyroidism, unspecified: Secondary | ICD-10-CM | POA: Diagnosis not present

## 2017-06-19 ENCOUNTER — Ambulatory Visit (HOSPITAL_COMMUNITY)
Admission: RE | Admit: 2017-06-19 | Discharge: 2017-06-19 | Disposition: A | Discharge status: Home / Self Care | Payer: Medicare Other | Source: Ambulatory Visit | Attending: Internal Medicine | Admitting: Internal Medicine

## 2017-06-19 DIAGNOSIS — Z1231 Encounter for screening mammogram for malignant neoplasm of breast: Secondary | ICD-10-CM

## 2017-07-27 LAB — BASIC METABOLIC PANEL: Glucose: 219

## 2017-08-13 ENCOUNTER — Ambulatory Visit (INDEPENDENT_AMBULATORY_CARE_PROVIDER_SITE_OTHER): Payer: Medicare Other | Admitting: Obstetrics & Gynecology

## 2017-08-13 ENCOUNTER — Encounter: Payer: Self-pay | Admitting: Obstetrics & Gynecology

## 2017-08-13 VITALS — BP 104/52 | HR 92 | Ht 67.0 in | Wt 181.0 lb

## 2017-08-13 DIAGNOSIS — L900 Lichen sclerosus et atrophicus: Secondary | ICD-10-CM

## 2017-08-13 MED ORDER — CLOBETASOL PROPIONATE 0.05 % EX CREA: 1.0000 "application " | TOPICAL_CREAM | Freq: Two times a day (BID) | CUTANEOUS | 11 refills | Status: DC

## 2017-08-13 NOTE — Progress Notes (Signed)
Chief Complaint  Patient presents with  . Vaginal Pain      81 y.o. G1P1001 No LMP recorded. Patient has had a hysterectomy. The current method of family planning is status post hysterectomy.  Outpatient Encounter Medications as of 08/13/2017  Medication Sig Note  . amLODipine-olmesartan (AZOR) 5-20 MG tablet Take 1 tablet by mouth daily.  06/26/2015: Received from: External Pharmacy  . aspirin 81 MG tablet Take 81 mg by mouth daily.   . hydrochlorothiazide (HYDRODIURIL) 25 MG tablet Take 1 tablet (25 mg total) by mouth daily.   Marland Kitchen. levothyroxine (SYNTHROID, LEVOTHROID) 75 MCG tablet Take 75 mcg by mouth daily before breakfast.   . metFORMIN (GLUCOPHAGE) 500 MG tablet Take 500 mg by mouth 2 (two) times daily with a meal.   . Multiple Vitamins-Minerals (VISION-VITE PRESERVE PO) Take by mouth.   . Calcium Carbonate-Vitamin D (CALCIUM 600+D) 600-400 MG-UNIT per tablet Take 2 tablets by mouth daily.   . clobetasol cream (TEMOVATE) 0.05 % Apply 1 application topically 2 (two) times daily.   . diclofenac sodium (VOLTAREN) 1 % GEL Apply topically 4 (four) times daily.   Marland Kitchen. HYDROcodone-acetaminophen (NORCO/VICODIN) 5-325 MG tablet Take 1-2 tablets by mouth every 6 (six) hours as needed for moderate pain.  06/26/2015: Patient states that she has taken 2 tabs since filling this medication  . methocarbamol (ROBAXIN) 500 MG tablet Take 1 tablet (500 mg total) by mouth every 6 (six) hours as needed for muscle spasms. (Patient not taking: Reported on 08/13/2017)   . Multiple Vitamin (MULTIVITAMIN) capsule Take 1 capsule by mouth daily.   Marland Kitchen. omeprazole (PRILOSEC) 20 MG capsule Take 20 mg by mouth daily.  06/26/2015: Received from: External Pharmacy  . traMADol (ULTRAM) 50 MG tablet Take 50 mg by mouth daily as needed for moderate pain or severe pain.  06/26/2015: Received from: External Pharmacy Received Sig:    No facility-administered encounter medications on file as of 08/13/2017.      Subjective Natalie Long is seen as a new patient She presents complaining of pain irritation and burning sensation in the vulvovaginal area The quality is burning to irritation the severity is moderate It last all the time Context is constant Timing over the last 6 months or so he may be even year Sometimes is worse when urine hits it Past Medical History:  Diagnosis Date  . Anxiety   . Arthritis   . Back pain   . Constipation   . Cystocele   . Depression   . Diabetes mellitus without complication (HCC)   . Hyperlipidemia   . Hypertension   . Rectocele   . Thyroid disease     Past Surgical History:  Procedure Laterality Date  . ABDOMINAL HYSTERECTOMY    . CATARACT EXTRACTION  2004 and 2008  . COLONOSCOPY  2004   Dr Rayann Hemanehman-normal  . COLONOSCOPY N/A 08/12/2012   Procedure: COLONOSCOPY;  Surgeon: Corbin Adeobert M Rourk, MD;  Location: [facility];  Service: Endoscopy;  Laterality: N/A;  11:30  . ESOPHAGOGASTRODUODENOSCOPY  01/01/2009   EAV:[id number]:Moderate-sized hiatal hernia.Normal esophagus.Antral nodule, status post biopsy    OB History    Gravida Para Term Preterm AB Living   1 1 1     1    SAB TAB Ectopic Multiple Live Births                  Allergies  Allergen Reactions  . Codeine Nausea Only    Social History   Socioeconomic  History  . Marital status: Widowed    Spouse name: None  . Number of children: None  . Years of education: None  . Highest education level: None  Social Needs  . Financial resource strain: None  . Food insecurity - worry: None  . Food insecurity - inability: None  . Transportation needs - medical: None  . Transportation needs - non-medical: None  Occupational History  . None  Tobacco Use  . Smoking status: Never Smoker  . Smokeless tobacco: Never Used  Substance and Sexual Activity  . Alcohol use: No  . Drug use: No  . Sexual activity: None  Other Topics Concern  . None  Social History Narrative  . None    Family History   Problem Relation Age of Onset  . Osteoporosis Mother   . Heart disease Father   . Hyperlipidemia Father   . Hypertension Father   . Depression Sister   . Cancer Sister        metastatic  . Diabetes Sister   . Colon cancer Sister     Medications:       Current Outpatient Medications:  .  amLODipine-olmesartan (AZOR) 5-20 MG tablet, Take 1 tablet by mouth daily. , Disp: , Rfl:  .  aspirin 81 MG tablet, Take 81 mg by mouth daily., Disp: , Rfl:  .  hydrochlorothiazide (HYDRODIURIL) 25 MG tablet, Take 1 tablet (25 mg total) by mouth daily., Disp: 90 tablet, Rfl: 1 .  levothyroxine (SYNTHROID, LEVOTHROID) 75 MCG tablet, Take 75 mcg by mouth daily before breakfast., Disp: , Rfl:  .  metFORMIN (GLUCOPHAGE) 500 MG tablet, Take 500 mg by mouth 2 (two) times daily with a meal., Disp: , Rfl:  .  Multiple Vitamins-Minerals (VISION-VITE PRESERVE PO), Take by mouth., Disp: , Rfl:  .  Calcium Carbonate-Vitamin D (CALCIUM 600+D) 600-400 MG-UNIT per tablet, Take 2 tablets by mouth daily., Disp: , Rfl:  .  clobetasol cream (TEMOVATE) 0.05 %, Apply 1 application topically 2 (two) times daily., Disp: 30 g, Rfl: 11 .  diclofenac sodium (VOLTAREN) 1 % GEL, Apply topically 4 (four) times daily., Disp: , Rfl:  .  HYDROcodone-acetaminophen (NORCO/VICODIN) 5-325 MG tablet, Take 1-2 tablets by mouth every 6 (six) hours as needed for moderate pain. , Disp: , Rfl:  .  methocarbamol (ROBAXIN) 500 MG tablet, Take 1 tablet (500 mg total) by mouth every 6 (six) hours as needed for muscle spasms. (Patient not taking: Reported on 08/13/2017), Disp: 56 tablet, Rfl: 1 .  Multiple Vitamin (MULTIVITAMIN) capsule, Take 1 capsule by mouth daily., Disp: , Rfl:  .  omeprazole (PRILOSEC) 20 MG capsule, Take 20 mg by mouth daily. , Disp: , Rfl:  .  traMADol (ULTRAM) 50 MG tablet, Take 50 mg by mouth daily as needed for moderate pain or severe pain. , Disp: , Rfl:   Objective Blood pressure (!) 104/52, pulse 92, height 5\' 7"  (1.702  m), weight 181 lb (82.1 kg).  General WDWN female NAD Vulva: Atrophic external genitalia with peri-vestibular erythema consistent with vulvar vestibulitis Vagina: Cuff is intact no discharge no evidence of yeast bacterial vaginosis no lesions or abnormalities Cervix: Absent Uterus: Absent Adnexa: ovaries:, Not present   Pertinent ROS No skin changes or rashes skin is warm nontender No burning with urination, frequency or urgency No nausea, vomiting or diarrhea Nor fever chills or other constitutional symptoms   Labs or studies     Impression Diagnoses this Encounter::   ICD-10-CM   1. Lichen  sclerosus et atrophicus L90.0     Established relevant diagnosis(es):   Plan/Recommendations: Meds ordered this encounter  Medications  . clobetasol cream (TEMOVATE) 0.05 %    Sig: Apply 1 application topically 2 (two) times daily.    Dispense:  30 g    Refill:  11    Labs or Scans Ordered: No orders of the defined types were placed in this encounter.   Management:: Temovate twice daily Follow-up in 6 weeks to chart response  Follow up Return in about 6 weeks (around 09/24/2017) for Follow up, with Dr Despina Hidden.     All questions were answered.

## 2017-08-14 ENCOUNTER — Encounter: Payer: Self-pay | Admitting: Obstetrics & Gynecology

## 2017-08-20 DIAGNOSIS — I739 Peripheral vascular disease, unspecified: Secondary | ICD-10-CM | POA: Diagnosis not present

## 2017-08-20 DIAGNOSIS — M79671 Pain in right foot: Secondary | ICD-10-CM | POA: Diagnosis not present

## 2017-08-20 DIAGNOSIS — M79672 Pain in left foot: Secondary | ICD-10-CM | POA: Diagnosis not present

## 2017-08-20 DIAGNOSIS — L110 Acquired keratosis follicularis: Secondary | ICD-10-CM | POA: Diagnosis not present

## 2017-08-25 DIAGNOSIS — E039 Hypothyroidism, unspecified: Secondary | ICD-10-CM | POA: Diagnosis not present

## 2017-08-25 DIAGNOSIS — E119 Type 2 diabetes mellitus without complications: Secondary | ICD-10-CM | POA: Diagnosis not present

## 2017-08-25 DIAGNOSIS — I10 Essential (primary) hypertension: Secondary | ICD-10-CM | POA: Diagnosis not present

## 2017-08-28 DIAGNOSIS — R600 Localized edema: Secondary | ICD-10-CM | POA: Diagnosis not present

## 2017-08-28 DIAGNOSIS — F5101 Primary insomnia: Secondary | ICD-10-CM | POA: Diagnosis not present

## 2017-08-28 DIAGNOSIS — I83813 Varicose veins of bilateral lower extremities with pain: Secondary | ICD-10-CM | POA: Diagnosis not present

## 2017-08-28 DIAGNOSIS — K219 Gastro-esophageal reflux disease without esophagitis: Secondary | ICD-10-CM | POA: Diagnosis not present

## 2017-08-31 ENCOUNTER — Ambulatory Visit (HOSPITAL_COMMUNITY)
Admission: RE | Admit: 2017-08-31 | Discharge: 2017-08-31 | Disposition: A | Discharge status: Home / Self Care | Payer: Medicare Other | Source: Ambulatory Visit | Attending: Adult Health Nurse Practitioner | Admitting: Adult Health Nurse Practitioner

## 2017-08-31 ENCOUNTER — Emergency Department (HOSPITAL_COMMUNITY)
Admission: EM | Admit: 2017-08-31 | Discharge: 2017-08-31 | Disposition: A | Discharge status: Home / Self Care | Payer: Medicare Other

## 2017-08-31 ENCOUNTER — Telehealth: Payer: Self-pay | Admitting: Orthopaedic Surgery

## 2017-08-31 DIAGNOSIS — M79672 Pain in left foot: Secondary | ICD-10-CM | POA: Diagnosis not present

## 2017-08-31 DIAGNOSIS — M7989 Other specified soft tissue disorders: Secondary | ICD-10-CM | POA: Diagnosis not present

## 2017-08-31 DIAGNOSIS — R52 Pain, unspecified: Secondary | ICD-10-CM

## 2017-08-31 DIAGNOSIS — R609 Edema, unspecified: Secondary | ICD-10-CM

## 2017-08-31 NOTE — Telephone Encounter (Signed)
Patient called wanting to be seen for her foot. I explained to her that our schedule is really tight, since we are with only one doctor at this time. She begged for me to work her in and I told her that Dr. Romeo AppleHarrison did not have available appointments for today. She wanted me to tell her what to do and I told her that she would have to make that decision. I offered an appointment for next Thursday and she declined saying she didn't think she could wait that long. I told her that I was sorry she was hurting and asked again about next Thursday. She stated she would contact her family doctor and see what he thinks. She thanked me and hung up.

## 2017-09-16 DIAGNOSIS — D509 Iron deficiency anemia, unspecified: Secondary | ICD-10-CM | POA: Diagnosis not present

## 2017-09-16 DIAGNOSIS — M79672 Pain in left foot: Secondary | ICD-10-CM | POA: Diagnosis not present

## 2017-09-25 ENCOUNTER — Ambulatory Visit: Payer: Medicare Other | Admitting: Obstetrics & Gynecology

## 2017-10-07 DIAGNOSIS — K219 Gastro-esophageal reflux disease without esophagitis: Secondary | ICD-10-CM | POA: Diagnosis not present

## 2017-10-07 DIAGNOSIS — N390 Urinary tract infection, site not specified: Secondary | ICD-10-CM | POA: Diagnosis not present

## 2017-10-07 DIAGNOSIS — R062 Wheezing: Secondary | ICD-10-CM | POA: Diagnosis not present

## 2017-10-07 DIAGNOSIS — Z6822 Body mass index (BMI) 22.0-22.9, adult: Secondary | ICD-10-CM | POA: Diagnosis not present

## 2017-10-07 DIAGNOSIS — R05 Cough: Secondary | ICD-10-CM | POA: Diagnosis not present

## 2017-10-15 ENCOUNTER — Encounter: Payer: Self-pay | Admitting: Orthopaedic Surgery

## 2017-10-15 ENCOUNTER — Ambulatory Visit (INDEPENDENT_AMBULATORY_CARE_PROVIDER_SITE_OTHER): Payer: Medicare Other | Admitting: Orthopaedic Surgery

## 2017-10-15 VITALS — BP 117/61 | HR 78 | Ht 67.0 in | Wt 166.0 lb

## 2017-10-15 DIAGNOSIS — S96912A Strain of unspecified muscle and tendon at ankle and foot level, left foot, initial encounter: Secondary | ICD-10-CM

## 2017-10-15 NOTE — Progress Notes (Signed)
Patient ZO:XWRUE [name], female DOB:December 23, 1935, 82 y.o. AVW:[id number]  Chief Complaint  Patient presents with  . Foot Pain    left    HPI  Natalie Long is a 82 y.o. female who twisted her left foot and ankle 08-31-17 while walking to her car.  She was seen and had x-rays of the left foot and ankle which were negative.  She has been using a cane, ice and elevation.  The ankle is still tender laterally.  She is a little better but she still has pain.  She has no new injury.  She has no redness or numbness. HPI  Body mass index is 26 kg/m.  ROS  Review of Systems  Constitutional: Positive for fatigue.  HENT: Positive for tinnitus.   Eyes: Positive for visual disturbance.  Respiratory: Positive for shortness of breath.   Cardiovascular: Positive for leg swelling.  Musculoskeletal: Positive for arthralgias, back pain, gait problem, joint swelling and myalgias.  Neurological: Positive for weakness.  Psychiatric/Behavioral: The patient is nervous/anxious.   All other systems reviewed and are negative.   Past Medical History:  Diagnosis Date  . Anxiety   . Arthritis   . Back pain   . Constipation   . Cystocele   . Depression   . Diabetes mellitus without complication (HCC)   . Hyperlipidemia   . Hypertension   . Rectocele   . Thyroid disease     Past Surgical History:  Procedure Laterality Date  . ABDOMINAL HYSTERECTOMY    . CATARACT EXTRACTION  2004 and 2008  . COLONOSCOPY  2004   Dr Rayann Heman  . COLONOSCOPY N/A 08/12/2012   Procedure: COLONOSCOPY;  Surgeon: Corbin Ade, MD;  Location: [facility];  Service: Endoscopy;  Laterality: N/A;  11:30  . ESOPHAGOGASTRODUODENOSCOPY  01/01/2009   WGN:[id number] hiatal hernia.Normal esophagus.Antral nodule, status post biopsy    Family History  Problem Relation Age of Onset  . Osteoporosis Mother   . Heart disease Father   . Hyperlipidemia Father   . Hypertension Father   . Depression Sister   . Cancer  Sister        metastatic  . Diabetes Sister   . Colon cancer Sister     Social History Social History   Tobacco Use  . Smoking status: Never Smoker  . Smokeless tobacco: Never Used  Substance Use Topics  . Alcohol use: No  . Drug use: No    Allergies  Allergen Reactions  . Codeine Nausea Only    Current Outpatient Medications  Medication Sig Dispense Refill  . amLODipine-olmesartan (AZOR) 5-20 MG tablet Take 1 tablet by mouth daily.     Marland Kitchen aspirin 81 MG tablet Take 81 mg by mouth daily.    . Calcium Carbonate-Vitamin D (CALCIUM 600+D) 600-400 MG-UNIT per tablet Take 2 tablets by mouth daily.    . clobetasol cream (TEMOVATE) 0.05 % Apply 1 application topically 2 (two) times daily. 30 g 11  . hydrochlorothiazide (HYDRODIURIL) 25 MG tablet Take 1 tablet (25 mg total) by mouth daily. 90 tablet 1  . levothyroxine (SYNTHROID, LEVOTHROID) 75 MCG tablet Take 75 mcg by mouth daily before breakfast.    . metFORMIN (GLUCOPHAGE) 500 MG tablet Take 500 mg by mouth 2 (two) times daily with a meal.    . Multiple Vitamin (MULTIVITAMIN) capsule Take 1 capsule by mouth daily.    . Multiple Vitamins-Minerals (VISION-VITE PRESERVE PO) Take by mouth.    . diclofenac sodium (VOLTAREN) 1 % GEL Apply  topically 4 (four) times daily.    Marland Kitchen HYDROcodone-acetaminophen (NORCO/VICODIN) 5-325 MG tablet Take 1-2 tablets by mouth every 6 (six) hours as needed for moderate pain.     Marland Kitchen omeprazole (PRILOSEC) 20 MG capsule Take 20 mg by mouth daily.     . traMADol (ULTRAM) 50 MG tablet Take 50 mg by mouth daily as needed for moderate pain or severe pain.      No current facility-administered medications for this visit.      Physical Exam  Blood pressure 117/61, pulse 78, height  (1.702 m), weight 166 lb (75.3 kg).  Constitutional: overall normal hygiene, normal nutrition, well developed, normal grooming, normal body habitus. Assistive device:cane  Musculoskeletal: gait and station Limp left, muscle  tone and strength are normal, no tremors or atrophy is present.  .  Neurological: coordination overall normal.  Deep tendon reflex/nerve stretch intact.  Sensation normal.  Cranial nerves II-XII intact.   Skin:   Normal overall no scars, lesions, ulcers or rashes. No psoriasis.  Psychiatric: Alert and oriented x 3.  Recent memory intact, remote memory unclear.  Normal mood and affect. Well groomed.  Good eye contact.  Cardiovascular: she has varicosities of the feet and ankle areas bilaterally, normal temperatures of the legs and arms, no clubbing, cyanosis and good capillary refill.  Lymphatic: palpation is normal.  Left ankle with lateral swelling and tenderness over the anterior talofibular ligament.  ROM decreased secondary to pain.  Limp to the left.  NV intact.  All other systems reviewed and are negative   The patient has been educated about the nature of the problem(s) and counseled on treatment options.  The patient appeared to understand what I have discussed and is in agreement with it.  Encounter Diagnosis  Name Primary?  . Strain of left ankle, initial encounter Yes    PLAN Call if any problems.  Precautions discussed.  Continue current medications. An Chief Operating Officer was given.  Instructions for use of Contrast Baths given.  Return to clinic 2 weeks   Electronically Signed Darreld Mclean, MD 5/16/20192:05 PM

## 2017-10-29 ENCOUNTER — Ambulatory Visit: Payer: Medicare Other | Admitting: Orthopaedic Surgery

## 2017-10-29 ENCOUNTER — Ambulatory Visit (INDEPENDENT_AMBULATORY_CARE_PROVIDER_SITE_OTHER): Payer: Medicare Other | Admitting: Orthopaedic Surgery

## 2017-10-29 ENCOUNTER — Ambulatory Visit (INDEPENDENT_AMBULATORY_CARE_PROVIDER_SITE_OTHER): Payer: Medicare Other

## 2017-10-29 ENCOUNTER — Encounter: Payer: Self-pay | Admitting: Orthopaedic Surgery

## 2017-10-29 VITALS — BP 135/73 | HR 114 | Temp 97.6°F | Ht 66.0 in | Wt 167.0 lb

## 2017-10-29 DIAGNOSIS — G8929 Other chronic pain: Secondary | ICD-10-CM

## 2017-10-29 DIAGNOSIS — M25572 Pain in left ankle and joints of left foot: Secondary | ICD-10-CM | POA: Diagnosis not present

## 2017-10-29 NOTE — Patient Instructions (Signed)
..  Your MRI has been ordered.  We will contact your insurance company for approval. After the authorization is received the MRI and a return appointment will be scheduled with you by phone.  If you do not hear from us within 5 business days please call [phone] and ask for the pre-authorization representative in our office.  

## 2017-10-29 NOTE — Progress Notes (Signed)
Patient Natalie Long, female DOB:[birth year], 82 y.o. AVW:[id number]  Chief Complaint  Patient presents with  . Ankle Pain    left    HPI  Natalie Long is a 82 y.o. female who has continued pain of the left ankle and foot.  She has more pain. She has some swelling.  She has been using Pharmacist, community.  She is very concerned about something wrong in her ankle.  She has no redness.  She has no numbness.  She has elevated it. HPI  Body mass index is 26.95 kg/m.  ROS  Review of Systems  Constitutional: Positive for fatigue.  HENT: Positive for tinnitus.   Eyes: Positive for visual disturbance.  Respiratory: Positive for shortness of breath.   Cardiovascular: Positive for leg swelling.  Musculoskeletal: Positive for arthralgias, back pain, gait problem, joint swelling and myalgias.  Neurological: Positive for weakness.  Psychiatric/Behavioral: The patient is nervous/anxious.   All other systems reviewed and are negative.   Past Medical History:  Diagnosis Date  . Anxiety   . Arthritis   . Back pain   . Constipation   . Cystocele   . Depression   . Diabetes mellitus without complication (HCC)   . Hyperlipidemia   . Hypertension   . Rectocele   . Thyroid disease     Past Surgical History:  Procedure Laterality Date  . ABDOMINAL HYSTERECTOMY    . CATARACT EXTRACTION  2004 and 2008  . COLONOSCOPY  2004   Dr Rayann Heman  . COLONOSCOPY N/A 08/12/2012   Procedure: COLONOSCOPY;  Surgeon: Corbin Ade, MD;  Location: [facility];  Service: Endoscopy;  Laterality: N/A;  11:30  . ESOPHAGOGASTRODUODENOSCOPY  01/01/2009   WGN:[id number] hiatal hernia.Normal esophagus.Antral nodule, status post biopsy    Family History  Problem Relation Age of Onset  . Osteoporosis Mother   . Heart disease Father   . Hyperlipidemia Father   . Hypertension Father   . Depression Sister   . Cancer Sister        metastatic  . Diabetes Sister   . Colon cancer Sister      Social History Social History   Tobacco Use  . Smoking status: Never Smoker  . Smokeless tobacco: Never Used  Substance Use Topics  . Alcohol use: No  . Drug use: No    Allergies  Allergen Reactions  . Codeine Nausea Only    Current Outpatient Medications  Medication Sig Dispense Refill  . amLODipine-olmesartan (AZOR) 5-20 MG tablet Take 1 tablet by mouth daily.     Marland Kitchen aspirin 81 MG tablet Take 81 mg by mouth daily.    . Calcium Carbonate-Vitamin D (CALCIUM 600+D) 600-400 MG-UNIT per tablet Take 2 tablets by mouth daily.    . clobetasol cream (TEMOVATE) 0.05 % Apply 1 application topically 2 (two) times daily. 30 g 11  . diclofenac sodium (VOLTAREN) 1 % GEL Apply topically 4 (four) times daily.    . hydrochlorothiazide (HYDRODIURIL) 25 MG tablet Take 1 tablet (25 mg total) by mouth daily. 90 tablet 1  . levothyroxine (SYNTHROID, LEVOTHROID) 75 MCG tablet Take 75 mcg by mouth daily before breakfast.    . metFORMIN (GLUCOPHAGE) 500 MG tablet Take 500 mg by mouth 2 (two) times daily with a meal.    . Multiple Vitamin (MULTIVITAMIN) capsule Take 1 capsule by mouth daily.    . Multiple Vitamins-Minerals (VISION-VITE PRESERVE PO) Take by mouth.    Marland Kitchen omeprazole (PRILOSEC) 20 MG capsule Take 20 mg  by mouth daily.     . traMADol (ULTRAM) 50 MG tablet Take 50 mg by mouth daily as needed for moderate pain or severe pain.     Marland Kitchen HYDROcodone-acetaminophen (NORCO/VICODIN) 5-325 MG tablet Take 1-2 tablets by mouth every 6 (six) hours as needed for moderate pain.      No current facility-administered medications for this visit.      Physical Exam  Blood pressure 135/73, pulse (!) 114, temperature 97.6 F (36.4 C), height  (1.676 m), weight 167 lb (75.8 kg).  Constitutional: overall normal hygiene, normal nutrition, well developed, normal grooming, normal body habitus. Assistive device:Air Cast  Musculoskeletal: gait and station Limp Left, muscle tone and strength are normal, no  tremors or atrophy is present.  .  Neurological: coordination overall normal.  Deep tendon reflex/nerve stretch intact.  Sensation normal.  Cranial nerves II-XII intact.   Skin:   Normal overall no scars, lesions, ulcers or rashes. No psoriasis.  Psychiatric: Alert and oriented x 3.  Recent memory intact, remote memory unclear.  Normal mood and affect. Well groomed.  Good eye contact.  Cardiovascular: overall no swelling, no varicosities, no edema bilaterally, normal temperatures of the legs and arms, no clubbing, cyanosis and good capillary refill.  Lymphatic: palpation is normal.  Left ankle has diffuse pain with more laterally.  She has slight lateral swelling.  ROM is full but painful.  She has limp to the left.  NV intact.  All other systems reviewed and are negative   The patient has been educated about the nature of the problem(s) and counseled on treatment options.  The patient appeared to understand what I have discussed and is in agreement with it.  X-rays of the left ankle were done and reported separately.  Encounter Diagnosis  Name Primary?  . Chronic pain of left ankle Yes    PLAN Call if any problems.  Precautions discussed.  Continue current medications.   I would like to get a MRI of the ankle to rule out occult injury.  Return to clinic afer MRI of the left ankle.   Electronically Signed Darreld Mclean, MD 5/30/20199:47 AM

## 2017-11-04 ENCOUNTER — Ambulatory Visit (HOSPITAL_COMMUNITY)
Admission: RE | Admit: 2017-11-04 | Discharge: 2017-11-04 | Disposition: A | Discharge status: Home / Self Care | Payer: Medicare Other | Source: Ambulatory Visit | Attending: Orthopaedic Surgery | Admitting: Orthopaedic Surgery

## 2017-11-04 DIAGNOSIS — M25572 Pain in left ankle and joints of left foot: Secondary | ICD-10-CM | POA: Diagnosis not present

## 2017-11-04 DIAGNOSIS — G8929 Other chronic pain: Secondary | ICD-10-CM | POA: Diagnosis not present

## 2017-11-04 DIAGNOSIS — R600 Localized edema: Secondary | ICD-10-CM | POA: Diagnosis not present

## 2017-11-10 ENCOUNTER — Ambulatory Visit (INDEPENDENT_AMBULATORY_CARE_PROVIDER_SITE_OTHER): Payer: Medicare Other | Admitting: Orthopaedic Surgery

## 2017-11-10 ENCOUNTER — Encounter: Payer: Self-pay | Admitting: Orthopaedic Surgery

## 2017-11-10 VITALS — BP 129/78 | HR 97 | Ht 66.0 in | Wt 169.0 lb

## 2017-11-10 DIAGNOSIS — S96912A Strain of unspecified muscle and tendon at ankle and foot level, left foot, initial encounter: Secondary | ICD-10-CM | POA: Diagnosis not present

## 2017-11-10 DIAGNOSIS — G8929 Other chronic pain: Secondary | ICD-10-CM | POA: Diagnosis not present

## 2017-11-10 DIAGNOSIS — M25572 Pain in left ankle and joints of left foot: Secondary | ICD-10-CM | POA: Diagnosis not present

## 2017-11-10 NOTE — Progress Notes (Signed)
Patient UJ:WJXBJ:Natalie Long, female DOB:[birth year], 82 y.o. YNW:[id number]  Chief Complaint  Patient presents with  . Results    MRI left ankle    HPI  Natalie Generaancy M Long is a 82 y.o. female who has continued left ankle pain and tenderness.  She had a MRI done and it showed: IMPRESSION: Multifocal marrow edema including involvement of the cuboid and bases of the second and third metatarsals likely due to contusions given history of trauma. The interosseous portion of the Lisfranc ligament is partially visualized and appears intact.  Peroneus longus tendinosis. Marrow edema in the peroneal tubercle of the calcaneus could be due to contusion but is likely reactive to tendinosis. Also seen is intense marrow edema in an os peroneus consistent with painful os peroneus syndrome.  I have explained the findings to her.  It will take a while to resolve.  She can remove the Air Cast if she wants.  HPI  Body mass index is 27.28 kg/m.  ROS  Review of Systems  Constitutional: Positive for fatigue.  HENT: Positive for tinnitus.   Eyes: Positive for visual disturbance.  Respiratory: Positive for shortness of breath.   Cardiovascular: Positive for leg swelling.  Musculoskeletal: Positive for arthralgias, back pain, gait problem, joint swelling and myalgias.  Neurological: Positive for weakness.  Psychiatric/Behavioral: The patient is nervous/anxious.   All other systems reviewed and are negative.   Past Medical History:  Diagnosis Date  . Anxiety   . Arthritis   . Back pain   . Constipation   . Cystocele   . Depression   . Diabetes mellitus without complication (HCC)   . Hyperlipidemia   . Hypertension   . Rectocele   . Thyroid disease     Past Surgical History:  Procedure Laterality Date  . ABDOMINAL HYSTERECTOMY    . CATARACT EXTRACTION  2004 and 2008  . COLONOSCOPY  2004   Dr Rayann Hemanehman-normal  . COLONOSCOPY N/A 08/12/2012   Procedure: COLONOSCOPY;  Surgeon: Corbin Adeobert M Rourk,  MD;  [facility];  Service: Endoscopy;  Laterality: N/A;  11:30  . ESOPHAGOGASTRODUODENOSCOPY  01/01/2009   MVH:[id number]:Moderate-sized hiatal hernia.Normal esophagus.Antral nodule, status post biopsy    Family History  Problem Relation Age of Onset  . Osteoporosis Mother   . Heart disease Father   . Hyperlipidemia Father   . Hypertension Father   . Depression Sister   . Cancer Sister        metastatic  . Diabetes Sister   . Colon cancer Sister     Social History Social History   Tobacco Use  . Smoking status: Never Smoker  . Smokeless tobacco: Never Used  Substance Use Topics  . Alcohol use: No  . Drug use: No    Allergies  Allergen Reactions  . Codeine Nausea Only    Current Outpatient Medications  Medication Sig Dispense Refill  . amLODipine-olmesartan (AZOR) 5-20 MG tablet Take 1 tablet by mouth daily.     Marland Kitchen. aspirin 81 MG tablet Take 81 mg by mouth daily.    . Calcium Carbonate-Vitamin D (CALCIUM 600+D) 600-400 MG-UNIT per tablet Take 2 tablets by mouth daily.    . clobetasol cream (TEMOVATE) 0.05 % Apply 1 application topically 2 (two) times daily. 30 g 11  . diclofenac sodium (VOLTAREN) 1 % GEL Apply topically 4 (four) times daily.    . hydrochlorothiazide (HYDRODIURIL) 25 MG tablet Take 1 tablet (25 mg total) by mouth daily. 90 tablet 1  . HYDROcodone-acetaminophen (NORCO/VICODIN) 5-325  MG tablet Take 1-2 tablets by mouth every 6 (six) hours as needed for moderate pain.     Marland Kitchen levothyroxine (SYNTHROID, LEVOTHROID) 75 MCG tablet Take 75 mcg by mouth daily before breakfast.    . metFORMIN (GLUCOPHAGE) 500 MG tablet Take 500 mg by mouth 2 (two) times daily with a meal.    . Multiple Vitamin (MULTIVITAMIN) capsule Take 1 capsule by mouth daily.    . Multiple Vitamins-Minerals (VISION-VITE PRESERVE PO) Take by mouth.    Marland Kitchen omeprazole (PRILOSEC) 20 MG capsule Take 20 mg by mouth daily.     . traMADol (ULTRAM) 50 MG tablet Take 50 mg by mouth daily as needed for  moderate pain or severe pain.      No current facility-administered medications for this visit.      Physical Exam  Blood pressure 129/78, pulse 97, height 5\' 6"  (1.676 m), weight 169 lb (76.7 kg).  Constitutional: overall normal hygiene, normal nutrition, well developed, normal grooming, normal body habitus. Assistive device:Air Cast  Musculoskeletal: gait and station Limp left, muscle tone and strength are normal, no tremors or atrophy is present.  .  Neurological: coordination overall normal.  Deep tendon reflex/nerve stretch intact.  Sensation normal.  Cranial nerves II-XII intact.   Skin:   Normal overall no scars, lesions, ulcers or rashes. No psoriasis.  Psychiatric: Alert and oriented x 3.  Recent memory intact, remote memory unclear.  Normal mood and affect. Well groomed.  Good eye contact.  Cardiovascular: overall no swelling, no varicosities, no edema bilaterally, normal temperatures of the legs and arms, no clubbing, cyanosis and good capillary refill.  Lymphatic: palpation is normal.  Left ankle has some lateral swelling and pain.  ROM is full but very tender.  NV intact.  Limp to the left.   All other systems reviewed and are negative   The patient has been educated about the nature of the problem(s) and counseled on treatment options.  The patient appeared to understand what I have discussed and is in agreement with it.  Encounter Diagnoses  Name Primary?  . Chronic pain of left ankle Yes  . Strain of left ankle, initial encounter     PLAN Call if any problems.  Precautions discussed.  Continue current medications.   Return to clinic 3 weeks   Electronically Signed Darreld Mclean, MD 6/11/201911:06 AM

## 2017-12-08 ENCOUNTER — Ambulatory Visit (INDEPENDENT_AMBULATORY_CARE_PROVIDER_SITE_OTHER): Payer: Medicare Other | Admitting: Orthopaedic Surgery

## 2017-12-08 ENCOUNTER — Encounter: Payer: Self-pay | Admitting: Orthopaedic Surgery

## 2017-12-08 VITALS — BP 147/71 | HR 107 | Ht 66.0 in | Wt 170.0 lb

## 2017-12-08 DIAGNOSIS — M7672 Peroneal tendinitis, left leg: Secondary | ICD-10-CM

## 2017-12-08 DIAGNOSIS — M25572 Pain in left ankle and joints of left foot: Secondary | ICD-10-CM

## 2017-12-08 DIAGNOSIS — G8929 Other chronic pain: Secondary | ICD-10-CM

## 2017-12-08 MED ORDER — DICLOFENAC SODIUM 1 % TD GEL: 4.0000 g | Freq: Four times a day (QID) | TRANSDERMAL | 1 refills | Status: DC

## 2017-12-08 NOTE — Progress Notes (Signed)
CC:  My foot still hurts  She has peroneal tendinitis of the left ankle and foot.  She still has pain.  Procedure note: After permission from the patient and sterile prep, the peroneal tendon sheath on the left ankle was injected with 1 % plain xylocaine and 1 cc of DepoMedrol 40 by sterile technique tolerated well.  Return in three weeks.  I have called in Voltaren 1% gel for her.  Call if any problem.  Precautions discussed.   Electronically Signed Darreld McleanWayne Gerilynn Mccullars, MD 7/9/201910:53 AM

## 2017-12-25 DIAGNOSIS — J040 Acute laryngitis: Secondary | ICD-10-CM | POA: Diagnosis not present

## 2017-12-25 DIAGNOSIS — R070 Pain in throat: Secondary | ICD-10-CM | POA: Diagnosis not present

## 2017-12-25 DIAGNOSIS — Z112 Encounter for screening for other bacterial diseases: Secondary | ICD-10-CM | POA: Diagnosis not present

## 2018-01-05 ENCOUNTER — Encounter: Payer: Self-pay | Admitting: Orthopaedic Surgery

## 2018-01-05 ENCOUNTER — Ambulatory Visit (INDEPENDENT_AMBULATORY_CARE_PROVIDER_SITE_OTHER): Payer: Medicare Other | Admitting: Orthopaedic Surgery

## 2018-01-05 VITALS — BP 157/75 | HR 103 | Ht 66.0 in | Wt 177.0 lb

## 2018-01-05 DIAGNOSIS — M7672 Peroneal tendinitis, left leg: Secondary | ICD-10-CM | POA: Diagnosis not present

## 2018-01-05 NOTE — Progress Notes (Signed)
CC:  My ankle is hurting again on the side  She has peroneal tendinitis of the lateral ankle on the left.  She did well from an injection in the past and wants another one today.  She has tenderness of the left ankle laterally.  NV intact.  Encounter Diagnosis  Name Primary?  . Peroneal tendinitis of left lower extremity Yes   Procedure note:  After permission from the patient the left lateral ankle was prepped and 1% plain Xylocaine and 1 cc of DepoMedrol 40 was injected by sterile technique in the area of the lateral peroneal tendons just posterior to the lateral malleolus. It was tolerated well.  I will see her as needed.  Call if any problem.  Precautions discussed.   Electronically Signed Darreld McleanWayne Johnnae Impastato, MD 8/6/20199:51 AM

## 2018-01-06 DIAGNOSIS — E039 Hypothyroidism, unspecified: Secondary | ICD-10-CM | POA: Diagnosis not present

## 2018-01-06 DIAGNOSIS — D509 Iron deficiency anemia, unspecified: Secondary | ICD-10-CM | POA: Diagnosis not present

## 2018-01-06 DIAGNOSIS — I10 Essential (primary) hypertension: Secondary | ICD-10-CM | POA: Diagnosis not present

## 2018-01-06 DIAGNOSIS — E1121 Type 2 diabetes mellitus with diabetic nephropathy: Secondary | ICD-10-CM | POA: Diagnosis not present

## 2018-01-06 DIAGNOSIS — E870 Hyperosmolality and hypernatremia: Secondary | ICD-10-CM | POA: Diagnosis not present

## 2018-01-08 ENCOUNTER — Other Ambulatory Visit (HOSPITAL_COMMUNITY): Payer: Self-pay | Admitting: Internal Medicine

## 2018-01-08 ENCOUNTER — Telehealth: Payer: Self-pay | Admitting: Orthopaedic Surgery

## 2018-01-08 ENCOUNTER — Ambulatory Visit (HOSPITAL_COMMUNITY)
Admission: RE | Admit: 2018-01-08 | Discharge: 2018-01-08 | Disposition: A | Discharge status: Home / Self Care | Payer: Medicare Other | Source: Ambulatory Visit | Attending: Internal Medicine | Admitting: Internal Medicine

## 2018-01-08 DIAGNOSIS — R609 Edema, unspecified: Secondary | ICD-10-CM

## 2018-01-08 DIAGNOSIS — D509 Iron deficiency anemia, unspecified: Secondary | ICD-10-CM | POA: Diagnosis not present

## 2018-01-08 DIAGNOSIS — M7989 Other specified soft tissue disorders: Secondary | ICD-10-CM | POA: Diagnosis not present

## 2018-01-08 DIAGNOSIS — R600 Localized edema: Secondary | ICD-10-CM | POA: Diagnosis not present

## 2018-01-08 DIAGNOSIS — M25561 Pain in right knee: Secondary | ICD-10-CM | POA: Diagnosis not present

## 2018-01-08 DIAGNOSIS — S8991XA Unspecified injury of right lower leg, initial encounter: Secondary | ICD-10-CM | POA: Diagnosis not present

## 2018-01-08 NOTE — Telephone Encounter (Signed)
Patient called this morning to get an appointment with Dr. Hilda LiasKeeling this morning. Stated she had fell yesterday and hurt her knee, this morning she can't walk on it. I reminded her that Dr. Hilda LiasKeeling is only here on Tuesdays, Wednesdays and Thursdays, then she asked if Dr. Romeo AppleHarrison was here. I told her that he will be seeing patient's this morning, but his schedule is full for today. I suggested to her that she may want to see her PCP or go to the ER. She stated she would have to wait until her daughter comes in from [place] and see what she thinks. She thanked me and hung up.

## 2018-01-12 ENCOUNTER — Ambulatory Visit (INDEPENDENT_AMBULATORY_CARE_PROVIDER_SITE_OTHER): Payer: Medicare Other | Admitting: Orthopaedic Surgery

## 2018-01-12 ENCOUNTER — Encounter: Payer: Self-pay | Admitting: Orthopaedic Surgery

## 2018-01-12 VITALS — BP 126/69 | HR 108 | Temp 98.3°F | Ht 66.0 in | Wt 171.0 lb

## 2018-01-12 DIAGNOSIS — G8929 Other chronic pain: Secondary | ICD-10-CM | POA: Diagnosis not present

## 2018-01-12 DIAGNOSIS — M25561 Pain in right knee: Secondary | ICD-10-CM

## 2018-01-12 NOTE — Progress Notes (Signed)
PROCEDURE NOTE:  The patient requests injections of the right knee , verbal consent was obtained.  The right knee was prepped appropriately after time out was performed.   Sterile technique was observed and injection of 1 cc of Depo-Medrol 40 mg with several cc's of plain xylocaine. Anesthesia was provided by ethyl chloride and a 20-gauge needle was used to inject the knee area. The injection was tolerated well.  A band aid dressing was applied.  The patient was advised to apply ice later today and tomorrow to the injection sight as needed.  I will see her in two weeks.  Call if any problem.  Precautions discussed.   Electronically Signed Darreld McleanWayne Deng Kemler, MD 8/13/20192:27 PM

## 2018-01-26 ENCOUNTER — Ambulatory Visit (INDEPENDENT_AMBULATORY_CARE_PROVIDER_SITE_OTHER): Payer: Medicare Other | Admitting: Orthopaedic Surgery

## 2018-01-26 ENCOUNTER — Encounter: Payer: Self-pay | Admitting: Orthopaedic Surgery

## 2018-01-26 VITALS — BP 133/71 | HR 96 | Ht 66.0 in | Wt 174.0 lb

## 2018-01-26 DIAGNOSIS — G8929 Other chronic pain: Secondary | ICD-10-CM | POA: Diagnosis not present

## 2018-01-26 DIAGNOSIS — H353111 Nonexudative age-related macular degeneration, right eye, early dry stage: Secondary | ICD-10-CM | POA: Diagnosis not present

## 2018-01-26 DIAGNOSIS — M25561 Pain in right knee: Secondary | ICD-10-CM

## 2018-01-26 DIAGNOSIS — H353122 Nonexudative age-related macular degeneration, left eye, intermediate dry stage: Secondary | ICD-10-CM | POA: Diagnosis not present

## 2018-01-26 DIAGNOSIS — H3530 Unspecified macular degeneration: Secondary | ICD-10-CM | POA: Diagnosis not present

## 2018-01-26 NOTE — Progress Notes (Signed)
CC:  I have pain of my right knee. I would like an injection.  The patient has chronic pain of the right knee.  There is no recent trauma.  There is no redness.  Injections in the past have helped.  The knee has no redness, has an effusion and crepitus present.  ROM of the right knee is 0-100.  Impression:  Chronic knee pain right  Return: 1 month  PROCEDURE NOTE:  The patient requests injections of the right knee , verbal consent was obtained.  The right knee was prepped appropriately after time out was performed.   Sterile technique was observed and injection of 1 cc of Depo-Medrol 40 mg with several cc's of plain xylocaine. Anesthesia was provided by ethyl chloride and a 20-gauge needle was used to inject the knee area. The injection was tolerated well.  A band aid dressing was applied.  The patient was advised to apply ice later today and tomorrow to the injection sight as needed.  Electronically Signed Darreld McleanWayne Patsye Sullivant, MD 8/27/20192:07 PM

## 2018-02-15 ENCOUNTER — Other Ambulatory Visit: Payer: Self-pay | Admitting: Orthopaedic Surgery

## 2018-02-18 DIAGNOSIS — K219 Gastro-esophageal reflux disease without esophagitis: Secondary | ICD-10-CM | POA: Diagnosis not present

## 2018-02-18 DIAGNOSIS — Z0001 Encounter for general adult medical examination with abnormal findings: Secondary | ICD-10-CM | POA: Diagnosis not present

## 2018-02-18 DIAGNOSIS — E1121 Type 2 diabetes mellitus with diabetic nephropathy: Secondary | ICD-10-CM | POA: Diagnosis not present

## 2018-02-18 DIAGNOSIS — I10 Essential (primary) hypertension: Secondary | ICD-10-CM | POA: Diagnosis not present

## 2018-02-23 ENCOUNTER — Ambulatory Visit (INDEPENDENT_AMBULATORY_CARE_PROVIDER_SITE_OTHER): Payer: Medicare Other | Admitting: Orthopaedic Surgery

## 2018-02-23 ENCOUNTER — Encounter: Payer: Self-pay | Admitting: Orthopaedic Surgery

## 2018-02-23 VITALS — BP 124/65 | HR 104 | Ht 66.0 in | Wt 175.0 lb

## 2018-02-23 DIAGNOSIS — G8929 Other chronic pain: Secondary | ICD-10-CM | POA: Diagnosis not present

## 2018-02-23 DIAGNOSIS — M25561 Pain in right knee: Secondary | ICD-10-CM | POA: Diagnosis not present

## 2018-02-23 NOTE — Progress Notes (Signed)
CC:  I have pain of my right knee. I would like an injection.  The patient has chronic pain of the right knee.  There is no recent trauma.  There is no redness.  Injections in the past have helped.  The knee has no redness, has an effusion and crepitus present.  ROM of the right knee is 0-100.  Impression:  Chronic knee pain right  Return: 1 month  PROCEDURE NOTE:  The patient requests injections of the right knee , verbal consent was obtained.  The right knee was prepped appropriately after time out was performed.   Sterile technique was observed and injection of 1 cc of Depo-Medrol 40 mg with several cc's of plain xylocaine. Anesthesia was provided by ethyl chloride and a 20-gauge needle was used to inject the knee area. The injection was tolerated well.  A band aid dressing was applied.  The patient was advised to apply ice later today and tomorrow to the injection sight as needed.  Electronically Signed Darreld McleanWayne Deigo Alonso, MD 9/24/20192:47 PM

## 2018-03-08 DIAGNOSIS — M79604 Pain in right leg: Secondary | ICD-10-CM | POA: Diagnosis not present

## 2018-03-08 DIAGNOSIS — I10 Essential (primary) hypertension: Secondary | ICD-10-CM | POA: Diagnosis not present

## 2018-03-08 DIAGNOSIS — E1140 Type 2 diabetes mellitus with diabetic neuropathy, unspecified: Secondary | ICD-10-CM | POA: Diagnosis not present

## 2018-03-08 DIAGNOSIS — Z23 Encounter for immunization: Secondary | ICD-10-CM | POA: Diagnosis not present

## 2018-03-08 DIAGNOSIS — G9009 Other idiopathic peripheral autonomic neuropathy: Secondary | ICD-10-CM | POA: Diagnosis not present

## 2018-03-10 DIAGNOSIS — L84 Corns and callosities: Secondary | ICD-10-CM | POA: Diagnosis not present

## 2018-03-10 DIAGNOSIS — E1121 Type 2 diabetes mellitus with diabetic nephropathy: Secondary | ICD-10-CM | POA: Diagnosis not present

## 2018-03-19 ENCOUNTER — Other Ambulatory Visit: Payer: Self-pay

## 2018-03-19 NOTE — Patient Outreach (Signed)
[facility]) Care Management  03/19/2018  Natalie Long [birth year] [id number]   Medication Adherence call to Mrs. Natalie Long patient's telephone number is disconnected patient is due on Metformin 500 mg. Mrs, Natalie Long is showing past due under [facility] Ins.  Lillia Abed CPhT Pharmacy Technician [facility] Management Direct Dial [phone]  Fax [phone] [email or web address]

## 2018-03-30 ENCOUNTER — Ambulatory Visit: Payer: Medicare Other | Admitting: Orthopaedic Surgery

## 2018-04-05 DIAGNOSIS — D509 Iron deficiency anemia, unspecified: Secondary | ICD-10-CM | POA: Diagnosis not present

## 2018-04-05 DIAGNOSIS — J060 Acute laryngopharyngitis: Secondary | ICD-10-CM | POA: Diagnosis not present

## 2018-04-05 DIAGNOSIS — I10 Essential (primary) hypertension: Secondary | ICD-10-CM | POA: Diagnosis not present

## 2018-04-05 DIAGNOSIS — E039 Hypothyroidism, unspecified: Secondary | ICD-10-CM | POA: Diagnosis not present

## 2018-04-05 DIAGNOSIS — E119 Type 2 diabetes mellitus without complications: Secondary | ICD-10-CM | POA: Diagnosis not present

## 2018-04-13 ENCOUNTER — Ambulatory Visit: Payer: Medicare Other | Admitting: Orthopaedic Surgery

## 2018-05-21 ENCOUNTER — Other Ambulatory Visit (HOSPITAL_COMMUNITY): Payer: Self-pay | Admitting: Internal Medicine

## 2018-05-21 DIAGNOSIS — Z1231 Encounter for screening mammogram for malignant neoplasm of breast: Secondary | ICD-10-CM

## 2018-06-11 DIAGNOSIS — E119 Type 2 diabetes mellitus without complications: Secondary | ICD-10-CM | POA: Diagnosis not present

## 2018-06-11 DIAGNOSIS — E039 Hypothyroidism, unspecified: Secondary | ICD-10-CM | POA: Diagnosis not present

## 2018-06-11 DIAGNOSIS — I10 Essential (primary) hypertension: Secondary | ICD-10-CM | POA: Diagnosis not present

## 2018-06-11 DIAGNOSIS — D509 Iron deficiency anemia, unspecified: Secondary | ICD-10-CM | POA: Diagnosis not present

## 2018-06-11 DIAGNOSIS — M816 Localized osteoporosis [Lequesne]: Secondary | ICD-10-CM | POA: Diagnosis not present

## 2018-06-14 DIAGNOSIS — I10 Essential (primary) hypertension: Secondary | ICD-10-CM | POA: Diagnosis not present

## 2018-06-14 DIAGNOSIS — G9009 Other idiopathic peripheral autonomic neuropathy: Secondary | ICD-10-CM | POA: Diagnosis not present

## 2018-06-14 DIAGNOSIS — K219 Gastro-esophageal reflux disease without esophagitis: Secondary | ICD-10-CM | POA: Diagnosis not present

## 2018-06-14 DIAGNOSIS — E1121 Type 2 diabetes mellitus with diabetic nephropathy: Secondary | ICD-10-CM | POA: Diagnosis not present

## 2018-06-15 ENCOUNTER — Encounter: Payer: Self-pay | Admitting: Cardiovascular Disease

## 2018-06-18 NOTE — Progress Notes (Signed)
Cardiology Office Note   Date:  06/21/2018   ID:  Natalie Long, DOB 1935-07-10, MRN [id number]  PCP:  Natalie Long, Natalie Z, MD  Cardiologist:   Natalie HawsPeter Jaziah Kwasnik, MD   No chief complaint on file.     History of Present Illness: Natalie Long is a 83 y.o. female who presents for consultation regarding CAD. Referred by Dr Natalie Long Patients activity is limited by knee pain and age. No history of CAD. CRF;s include DM and HTN.  Also  Has hypothyroidism generalized anxiety She is worried about her family history of CAD brothers and sisters Passed away from MI"s   Has occassionally atypical pain in chest Resting sharp but left sided Chronic issue Widowed Has a  Daughter in [place] that sees her She is independent and still drives Pain is not always exertional  And she admits it may be from anxiety with all of her brothers and sisters passing before her    Past Medical History:  Diagnosis Date  . Anxiety   . Arthritis   . Back pain   . Constipation   . Cystocele   . Depression   . Diabetes mellitus without complication (HCC)   . Gastric reflux   . Hyperlipidemia   . Hypertension   . Rectocele   . Thyroid disease     Past Surgical History:  Procedure Laterality Date  . ABDOMINAL HYSTERECTOMY    . CATARACT EXTRACTION  2004 and 2008  . COLONOSCOPY  2004   Dr Rayann Hemanehman-normal  . COLONOSCOPY N/A 08/12/2012   Procedure: COLONOSCOPY;  Surgeon: Corbin Adeobert M Rourk, MD;  [facility];  Service: Endoscopy;  Laterality: N/A;  11:30  . ESOPHAGOGASTRODUODENOSCOPY  01/01/2009   WUJ:[id number]:Moderate-sized hiatal hernia.Normal esophagus.Antral nodule, status post biopsy     Current Outpatient Medications  Medication Sig Dispense Refill  . ALPRAZolam (XANAX) 0.5 MG tablet Take 0.5 mg by mouth at bedtime.     Marland Kitchen. amLODipine-olmesartan (AZOR) 5-20 MG tablet Take 1 tablet by mouth daily.     . Calcium Carbonate-Vitamin D (CALCIUM 600+D) 600-400 MG-UNIT per tablet Take 2 tablets by mouth daily.    .  clobetasol cream (TEMOVATE) 0.05 % Apply 1 application topically 2 (two) times daily. 30 g 11  . diclofenac sodium (VOLTAREN) 1 % GEL Apply 4 g topically 4 (four) times daily. 5 Tube 1  . hydrochlorothiazide (HYDRODIURIL) 25 MG tablet Take 1 tablet (25 mg total) by mouth daily. 90 tablet 1  . HYDROcodone-acetaminophen (NORCO/VICODIN) 5-325 MG tablet Take 1-2 tablets by mouth every 6 (six) hours as needed for moderate pain.     Marland Kitchen. levothyroxine (SYNTHROID, LEVOTHROID) 75 MCG tablet Take 75 mcg by mouth daily before breakfast.    . metFORMIN (GLUCOPHAGE) 500 MG tablet Take 500 mg by mouth 2 (two) times daily with a meal.    . Multiple Vitamins-Minerals (VISION-VITE PRESERVE PO) Take by mouth.    Marland Kitchen. omeprazole (PRILOSEC) 20 MG capsule Take 20 mg by mouth daily.     . ONE TOUCH ULTRA TEST test strip     . predniSONE (DELTASONE) 10 MG tablet Take 10 mg by mouth daily with breakfast.     . traMADol (ULTRAM) 50 MG tablet Take 50 mg by mouth daily as needed for moderate pain or severe pain.      No current facility-administered medications for this visit.     Allergies:   Codeine    Social History:  The patient  reports that she has never  smoked. She has never used smokeless tobacco. She reports that she does not drink alcohol or use drugs.   Family History:  The patient's family history includes Cancer in her sister; Colon cancer in her sister; Depression in her sister; Diabetes in her father and sister; Heart disease in her father; High blood pressure in her father and mother; Hyperlipidemia in her father; Hypertension in her father; Osteoporosis in her mother.    ROS:  Please see the history of present illness.   Otherwise, review of systems are positive for none.   All other systems are reviewed and negative.    PHYSICAL EXAM: VS:  BP 134/74 (BP Location: Left Arm)   Pulse (!) 104   Ht 5\' 7"  (1.702 m)   Wt 173 lb (78.5 kg)   SpO2 96%   BMI 27.10 kg/m  , BMI Body mass index is 27.1  kg/m. Affect appropriate Healthy:  appears stated age HEENT: normal Neck supple with no adenopathy JVP normal no bruits no thyromegaly Lungs clear with no wheezing and good diaphragmatic motion Heart:  S1/S2 no murmur, no rub, gallop or click PMI normal Abdomen: benighn, BS positve, no tenderness, no AAA no bruit.  No HSM or HJR Distal pulses intact with no bruits No edema Neuro non-focal Skin warm and dry No muscular weakness    EKG:  2017 ST rate 117 otherwise normal  06/21/18 SR low voltage no acute changes    Recent Labs: No results found for requested labs within last 8760 hours.    Lipid Panel    Component Value Date/Time   CHOL 154 03/11/2012 1129   TRIG 107 03/11/2012 1129   HDL 42 03/11/2012 1129   CHOLHDL 3.7 03/11/2012 1129   VLDL 21 03/11/2012 1129   LDLCALC 91 03/11/2012 1129      Wt Readings from Last 3 Encounters:  06/21/18 173 lb (78.5 kg)  02/23/18 175 lb (79.4 kg)  01/26/18 174 lb (78.9 kg)      Other studies Reviewed: Additional studies/ records that were reviewed today include: Notes from Dr Natalie Long labs and ECG .    ASSESSMENT AND PLAN:  1.  Chest Pain: family history CAD f/u lexiscan myovue  2.  HTN: Well controlled.  Continue current medications and low sodium Dash type diet.   3. DM:  Discussed low carb diet.  Target hemoglobin A1c is 6.5 or less.  Continue current medications.    Current medicines are reviewed at length with the patient today.  The patient does not have concerns regarding medicines.  The following changes have been made:  no change  Labs/ tests ordered today include: Lexiscan myovue   Orders Placed This Encounter  Procedures  . NM Myocar Multi W/Spect W/Wall Motion / EF  . EKG 12-Lead     Disposition:   FU with cardiology PRN      Signed, Natalie HawsPeter Maryori Weide, MD  06/21/2018 1:48 PM    [facility] [address], [place], [place]  [id number] Phone: [phone]; Fax: [phone]

## 2018-06-21 ENCOUNTER — Ambulatory Visit (HOSPITAL_COMMUNITY)
Admission: RE | Admit: 2018-06-21 | Discharge: 2018-06-21 | Disposition: A | Discharge status: Home / Self Care | Payer: Medicare Other | Source: Ambulatory Visit | Attending: Internal Medicine | Admitting: Internal Medicine

## 2018-06-21 ENCOUNTER — Encounter: Payer: Self-pay | Admitting: Cardiovascular Disease

## 2018-06-21 ENCOUNTER — Encounter (HOSPITAL_COMMUNITY): Payer: Self-pay

## 2018-06-21 ENCOUNTER — Telehealth: Payer: Self-pay | Admitting: Cardiovascular Disease

## 2018-06-21 ENCOUNTER — Ambulatory Visit (INDEPENDENT_AMBULATORY_CARE_PROVIDER_SITE_OTHER): Payer: Medicare Other | Admitting: Cardiovascular Disease

## 2018-06-21 VITALS — BP 134/74 | HR 104 | Ht 67.0 in | Wt 173.0 lb

## 2018-06-21 DIAGNOSIS — R079 Chest pain, unspecified: Secondary | ICD-10-CM

## 2018-06-21 DIAGNOSIS — K219 Gastro-esophageal reflux disease without esophagitis: Secondary | ICD-10-CM

## 2018-06-21 DIAGNOSIS — Z1231 Encounter for screening mammogram for malignant neoplasm of breast: Secondary | ICD-10-CM | POA: Insufficient documentation

## 2018-06-21 NOTE — Telephone Encounter (Signed)
°  Precert needed for: Lexiscan   [facility]: [facility]     Date: Jul 01, 2018 arrive 10:00

## 2018-06-21 NOTE — Patient Instructions (Signed)
Medication Instructions:  Your physician recommends that you continue on your current medications as directed. Please refer to the Current Medication list given to you today.  If you need a refill on your cardiac medications before your next appointment, please call your pharmacy.   Lab work: Your physician recommends that you continue on your current medications as directed. Please refer to the Current Medication list given to you today.  If you have labs (blood work) drawn today and your tests are completely normal, you will receive your results only by: Marland Kitchen. MyChart Message (if you have MyChart) OR . A paper copy in the mail If you have any lab test that is abnormal or we need to change your treatment, we will call you to review the results.  Testing/Procedures: Your physician has requested that you have a lexiscan myoview. For further information please visit [email or web address]. Please follow instruction sheet, as given.    Follow-Up: as needed with Dr.nishan  Any Other Special Instructions Will Be Listed Below (If Applicable). None

## 2018-06-28 ENCOUNTER — Telehealth: Payer: Self-pay | Admitting: Cardiovascular Disease

## 2018-06-28 NOTE — Telephone Encounter (Signed)
Patient would like to discuss myoview instructions with nurse. / tg

## 2018-06-28 NOTE — Telephone Encounter (Signed)
Returned pt call. She just needed reassurance that her stress test was not going to be too hard on her. I explained it to her and she felt better about having it done. She thanked me for calling.

## 2018-07-01 ENCOUNTER — Ambulatory Visit (HOSPITAL_COMMUNITY)
Admission: RE | Admit: 2018-07-01 | Discharge: 2018-07-01 | Disposition: A | Discharge status: Home / Self Care | Payer: Medicare Other | Source: Ambulatory Visit | Attending: Cardiovascular Disease | Admitting: Cardiovascular Disease

## 2018-07-01 ENCOUNTER — Encounter (HOSPITAL_COMMUNITY): Payer: Self-pay

## 2018-07-01 ENCOUNTER — Encounter (HOSPITAL_COMMUNITY)
Admission: RE | Admit: 2018-07-01 | Discharge: 2018-07-01 | Disposition: A | Discharge status: Home / Self Care | Payer: Medicare Other | Source: Ambulatory Visit | Attending: Cardiovascular Disease | Admitting: Cardiovascular Disease

## 2018-07-01 DIAGNOSIS — R079 Chest pain, unspecified: Secondary | ICD-10-CM | POA: Insufficient documentation

## 2018-07-01 LAB — NM MYOCAR MULTI W/SPECT W/WALL MOTION / EF
CSEPPHR: 114 {beats}/min
LV dias vol: 73 mL (ref 46–106)
LV sys vol: 25 mL
RATE: 0.47
Rest HR: 68 {beats}/min
SDS: 0
SRS: 3
SSS: 3
TID: 1.27

## 2018-07-01 MED ORDER — SODIUM CHLORIDE 0.9% FLUSH
INTRAVENOUS | Status: AC
  Administered 2018-07-01: 10 mL via INTRAVENOUS
  Filled 2018-07-01: qty 10

## 2018-07-01 MED ORDER — TECHNETIUM TC 99M TETROFOSMIN IV KIT
30.0000 | PACK | Freq: Once | INTRAVENOUS | Status: AC | PRN
  Administered 2018-07-01: 30.6 via INTRAVENOUS

## 2018-07-01 MED ORDER — TECHNETIUM TC 99M TETROFOSMIN IV KIT
10.0000 | PACK | Freq: Once | INTRAVENOUS | Status: AC | PRN
  Administered 2018-07-01: 10.75 via INTRAVENOUS

## 2018-07-01 MED ORDER — REGADENOSON 0.4 MG/5ML IV SOLN
INTRAVENOUS | Status: AC
  Administered 2018-07-01: 0.4 mg via INTRAVENOUS
  Filled 2018-07-01: qty 5

## 2018-07-05 ENCOUNTER — Telehealth: Payer: Self-pay | Admitting: Cardiology

## 2018-07-05 NOTE — Telephone Encounter (Signed)
Patient is requesting a copy of her myoview be mailed to her. / tg

## 2018-07-05 NOTE — Telephone Encounter (Signed)
done

## 2018-07-15 DIAGNOSIS — K219 Gastro-esophageal reflux disease without esophagitis: Secondary | ICD-10-CM | POA: Diagnosis not present

## 2018-07-15 DIAGNOSIS — E039 Hypothyroidism, unspecified: Secondary | ICD-10-CM | POA: Diagnosis not present

## 2018-07-15 DIAGNOSIS — E1121 Type 2 diabetes mellitus with diabetic nephropathy: Secondary | ICD-10-CM | POA: Diagnosis not present

## 2018-07-15 DIAGNOSIS — I10 Essential (primary) hypertension: Secondary | ICD-10-CM | POA: Diagnosis not present

## 2018-07-20 DIAGNOSIS — J060 Acute laryngopharyngitis: Secondary | ICD-10-CM | POA: Diagnosis not present

## 2018-07-20 DIAGNOSIS — R062 Wheezing: Secondary | ICD-10-CM | POA: Diagnosis not present

## 2018-10-06 DIAGNOSIS — Z0000 Encounter for general adult medical examination without abnormal findings: Secondary | ICD-10-CM | POA: Diagnosis not present

## 2018-10-07 ENCOUNTER — Other Ambulatory Visit: Payer: Self-pay | Admitting: Internal Medicine

## 2018-10-07 DIAGNOSIS — Z780 Asymptomatic menopausal state: Secondary | ICD-10-CM

## 2018-10-15 DIAGNOSIS — E039 Hypothyroidism, unspecified: Secondary | ICD-10-CM | POA: Diagnosis not present

## 2018-10-15 DIAGNOSIS — I10 Essential (primary) hypertension: Secondary | ICD-10-CM | POA: Diagnosis not present

## 2018-10-15 DIAGNOSIS — E1140 Type 2 diabetes mellitus with diabetic neuropathy, unspecified: Secondary | ICD-10-CM | POA: Diagnosis not present

## 2018-10-15 DIAGNOSIS — E119 Type 2 diabetes mellitus without complications: Secondary | ICD-10-CM | POA: Diagnosis not present

## 2018-10-15 DIAGNOSIS — D509 Iron deficiency anemia, unspecified: Secondary | ICD-10-CM | POA: Diagnosis not present

## 2018-10-20 DIAGNOSIS — E1142 Type 2 diabetes mellitus with diabetic polyneuropathy: Secondary | ICD-10-CM | POA: Diagnosis not present

## 2018-10-20 DIAGNOSIS — G990 Autonomic neuropathy in diseases classified elsewhere: Secondary | ICD-10-CM | POA: Diagnosis not present

## 2018-10-20 DIAGNOSIS — K219 Gastro-esophageal reflux disease without esophagitis: Secondary | ICD-10-CM | POA: Diagnosis not present

## 2018-10-20 DIAGNOSIS — Z0001 Encounter for general adult medical examination with abnormal findings: Secondary | ICD-10-CM | POA: Diagnosis not present

## 2018-10-20 DIAGNOSIS — I10 Essential (primary) hypertension: Secondary | ICD-10-CM | POA: Diagnosis not present

## 2018-11-02 DIAGNOSIS — Z1211 Encounter for screening for malignant neoplasm of colon: Secondary | ICD-10-CM | POA: Diagnosis not present

## 2018-11-02 DIAGNOSIS — M816 Localized osteoporosis [Lequesne]: Secondary | ICD-10-CM | POA: Diagnosis not present

## 2018-11-02 DIAGNOSIS — F5101 Primary insomnia: Secondary | ICD-10-CM | POA: Diagnosis not present

## 2018-11-02 DIAGNOSIS — Z112 Encounter for screening for other bacterial diseases: Secondary | ICD-10-CM | POA: Diagnosis not present

## 2018-11-02 DIAGNOSIS — Z1212 Encounter for screening for malignant neoplasm of rectum: Secondary | ICD-10-CM | POA: Diagnosis not present

## 2018-11-02 DIAGNOSIS — M25519 Pain in unspecified shoulder: Secondary | ICD-10-CM | POA: Diagnosis not present

## 2018-11-02 DIAGNOSIS — I83813 Varicose veins of bilateral lower extremities with pain: Secondary | ICD-10-CM | POA: Diagnosis not present

## 2018-11-09 ENCOUNTER — Other Ambulatory Visit: Payer: Self-pay

## 2018-11-09 NOTE — Patient Outreach (Signed)
[facility]) Care Management  11/09/2018  Natalie Long 1936/01/02 [id number]   Medication Adherence call to [name] patient's telephone number is disconnected patient is past due on Metformin 500 mg under [place].   [place] Management Direct Dial [phone]  Fax [phone] [email or web address]

## 2018-12-06 ENCOUNTER — Other Ambulatory Visit: Payer: Self-pay

## 2018-12-06 NOTE — Patient Outreach (Signed)
[facility]) Care Management  12/06/2018  Natalie Long 06/21/1935 [id number]   Medication Adherence call to Natalie Long patient has a disconnected number patient is showing past due under [place].   [place] Management Direct Dial [phone]  Fax [phone] [email or web address]

## 2018-12-14 DIAGNOSIS — M79675 Pain in left toe(s): Secondary | ICD-10-CM | POA: Diagnosis not present

## 2018-12-14 DIAGNOSIS — M79672 Pain in left foot: Secondary | ICD-10-CM | POA: Diagnosis not present

## 2018-12-14 DIAGNOSIS — S92335D Nondisplaced fracture of third metatarsal bone, left foot, subsequent encounter for fracture with routine healing: Secondary | ICD-10-CM | POA: Diagnosis not present

## 2019-02-17 DIAGNOSIS — D509 Iron deficiency anemia, unspecified: Secondary | ICD-10-CM | POA: Diagnosis not present

## 2019-02-17 DIAGNOSIS — E119 Type 2 diabetes mellitus without complications: Secondary | ICD-10-CM | POA: Diagnosis not present

## 2019-02-17 DIAGNOSIS — I10 Essential (primary) hypertension: Secondary | ICD-10-CM | POA: Diagnosis not present

## 2019-02-22 DIAGNOSIS — H5213 Myopia, bilateral: Secondary | ICD-10-CM | POA: Diagnosis not present

## 2019-02-23 DIAGNOSIS — D509 Iron deficiency anemia, unspecified: Secondary | ICD-10-CM | POA: Diagnosis not present

## 2019-02-23 DIAGNOSIS — E119 Type 2 diabetes mellitus without complications: Secondary | ICD-10-CM | POA: Diagnosis not present

## 2019-02-23 DIAGNOSIS — I10 Essential (primary) hypertension: Secondary | ICD-10-CM | POA: Diagnosis not present

## 2019-02-23 DIAGNOSIS — M816 Localized osteoporosis [Lequesne]: Secondary | ICD-10-CM | POA: Diagnosis not present

## 2019-02-23 DIAGNOSIS — E039 Hypothyroidism, unspecified: Secondary | ICD-10-CM | POA: Diagnosis not present

## 2019-02-28 DIAGNOSIS — K219 Gastro-esophageal reflux disease without esophagitis: Secondary | ICD-10-CM | POA: Diagnosis not present

## 2019-02-28 DIAGNOSIS — I10 Essential (primary) hypertension: Secondary | ICD-10-CM | POA: Diagnosis not present

## 2019-02-28 DIAGNOSIS — E1142 Type 2 diabetes mellitus with diabetic polyneuropathy: Secondary | ICD-10-CM | POA: Diagnosis not present

## 2019-02-28 DIAGNOSIS — E039 Hypothyroidism, unspecified: Secondary | ICD-10-CM | POA: Diagnosis not present

## 2019-02-28 DIAGNOSIS — G990 Autonomic neuropathy in diseases classified elsewhere: Secondary | ICD-10-CM | POA: Diagnosis not present

## 2019-03-07 DIAGNOSIS — L255 Unspecified contact dermatitis due to plants, except food: Secondary | ICD-10-CM | POA: Diagnosis not present

## 2019-03-15 DIAGNOSIS — H52221 Regular astigmatism, right eye: Secondary | ICD-10-CM | POA: Diagnosis not present

## 2019-03-15 DIAGNOSIS — H524 Presbyopia: Secondary | ICD-10-CM | POA: Diagnosis not present

## 2019-03-16 ENCOUNTER — Other Ambulatory Visit: Payer: Self-pay | Admitting: *Deleted

## 2019-03-16 DIAGNOSIS — Z20822 Contact with and (suspected) exposure to covid-19: Secondary | ICD-10-CM

## 2019-03-17 LAB — NOVEL CORONAVIRUS, NAA: SARS-CoV-2, NAA: NOT DETECTED

## 2019-03-21 DIAGNOSIS — E119 Type 2 diabetes mellitus without complications: Secondary | ICD-10-CM | POA: Diagnosis not present

## 2019-03-21 DIAGNOSIS — I10 Essential (primary) hypertension: Secondary | ICD-10-CM | POA: Diagnosis not present

## 2019-03-21 DIAGNOSIS — D509 Iron deficiency anemia, unspecified: Secondary | ICD-10-CM | POA: Diagnosis not present

## 2019-03-22 DIAGNOSIS — L237 Allergic contact dermatitis due to plants, except food: Secondary | ICD-10-CM | POA: Diagnosis not present

## 2019-03-22 DIAGNOSIS — R0602 Shortness of breath: Secondary | ICD-10-CM | POA: Diagnosis not present

## 2019-03-28 ENCOUNTER — Telehealth: Payer: Self-pay | Admitting: Internal Medicine

## 2019-03-28 NOTE — Telephone Encounter (Signed)
Negative COVID results given. Patient results "NOT Detected." Caller expressed understanding. ° °

## 2019-04-04 ENCOUNTER — Other Ambulatory Visit: Payer: Self-pay | Admitting: Obstetrics & Gynecology

## 2019-04-25 DIAGNOSIS — I10 Essential (primary) hypertension: Secondary | ICD-10-CM | POA: Diagnosis not present

## 2019-04-25 DIAGNOSIS — D509 Iron deficiency anemia, unspecified: Secondary | ICD-10-CM | POA: Diagnosis not present

## 2019-04-25 DIAGNOSIS — E119 Type 2 diabetes mellitus without complications: Secondary | ICD-10-CM | POA: Diagnosis not present

## 2019-06-06 ENCOUNTER — Other Ambulatory Visit: Payer: Self-pay

## 2019-06-06 ENCOUNTER — Ambulatory Visit: Payer: Medicare Other | Attending: Internal Medicine

## 2019-06-06 DIAGNOSIS — Z20822 Contact with and (suspected) exposure to covid-19: Secondary | ICD-10-CM

## 2019-06-06 DIAGNOSIS — E119 Type 2 diabetes mellitus without complications: Secondary | ICD-10-CM | POA: Diagnosis not present

## 2019-06-06 DIAGNOSIS — I10 Essential (primary) hypertension: Secondary | ICD-10-CM | POA: Diagnosis not present

## 2019-06-07 DIAGNOSIS — I739 Peripheral vascular disease, unspecified: Secondary | ICD-10-CM | POA: Diagnosis not present

## 2019-06-07 DIAGNOSIS — M79671 Pain in right foot: Secondary | ICD-10-CM | POA: Diagnosis not present

## 2019-06-07 DIAGNOSIS — S93332D Other subluxation of left foot, subsequent encounter: Secondary | ICD-10-CM | POA: Diagnosis not present

## 2019-06-07 DIAGNOSIS — L110 Acquired keratosis follicularis: Secondary | ICD-10-CM | POA: Diagnosis not present

## 2019-06-07 DIAGNOSIS — S93331D Other subluxation of right foot, subsequent encounter: Secondary | ICD-10-CM | POA: Diagnosis not present

## 2019-06-07 LAB — NOVEL CORONAVIRUS, NAA: SARS-CoV-2, NAA: NOT DETECTED

## 2019-06-27 DIAGNOSIS — I10 Essential (primary) hypertension: Secondary | ICD-10-CM | POA: Diagnosis not present

## 2019-06-27 DIAGNOSIS — E119 Type 2 diabetes mellitus without complications: Secondary | ICD-10-CM | POA: Diagnosis not present

## 2019-06-27 DIAGNOSIS — D509 Iron deficiency anemia, unspecified: Secondary | ICD-10-CM | POA: Diagnosis not present

## 2019-06-27 DIAGNOSIS — K219 Gastro-esophageal reflux disease without esophagitis: Secondary | ICD-10-CM | POA: Diagnosis not present

## 2019-06-27 DIAGNOSIS — F5101 Primary insomnia: Secondary | ICD-10-CM | POA: Diagnosis not present

## 2019-07-01 DIAGNOSIS — K219 Gastro-esophageal reflux disease without esophagitis: Secondary | ICD-10-CM | POA: Diagnosis not present

## 2019-07-01 DIAGNOSIS — E039 Hypothyroidism, unspecified: Secondary | ICD-10-CM | POA: Diagnosis not present

## 2019-07-01 DIAGNOSIS — G990 Autonomic neuropathy in diseases classified elsewhere: Secondary | ICD-10-CM | POA: Diagnosis not present

## 2019-07-01 DIAGNOSIS — I10 Essential (primary) hypertension: Secondary | ICD-10-CM | POA: Diagnosis not present

## 2019-07-01 DIAGNOSIS — E1142 Type 2 diabetes mellitus with diabetic polyneuropathy: Secondary | ICD-10-CM | POA: Diagnosis not present

## 2019-07-04 ENCOUNTER — Other Ambulatory Visit (HOSPITAL_COMMUNITY): Payer: Self-pay | Admitting: Internal Medicine

## 2019-07-04 DIAGNOSIS — Z1231 Encounter for screening mammogram for malignant neoplasm of breast: Secondary | ICD-10-CM

## 2019-07-07 ENCOUNTER — Other Ambulatory Visit: Payer: Self-pay

## 2019-07-07 ENCOUNTER — Ambulatory Visit (HOSPITAL_COMMUNITY)
Admission: RE | Admit: 2019-07-07 | Discharge: 2019-07-07 | Disposition: A | Discharge status: Home / Self Care | Payer: Medicare Other | Source: Ambulatory Visit | Attending: Internal Medicine | Admitting: Internal Medicine

## 2019-07-07 DIAGNOSIS — Z1231 Encounter for screening mammogram for malignant neoplasm of breast: Secondary | ICD-10-CM | POA: Diagnosis not present

## 2019-08-26 ENCOUNTER — Other Ambulatory Visit: Payer: Self-pay

## 2019-08-26 ENCOUNTER — Emergency Department (HOSPITAL_COMMUNITY): Payer: Medicare Other

## 2019-08-26 ENCOUNTER — Encounter (HOSPITAL_COMMUNITY): Payer: Self-pay | Admitting: *Deleted

## 2019-08-26 ENCOUNTER — Emergency Department (HOSPITAL_COMMUNITY)
Admission: EM | Admit: 2019-08-26 | Discharge: 2019-08-26 | Disposition: A | Discharge status: Home / Self Care | Payer: Medicare Other | Attending: Emergency Medicine | Admitting: Emergency Medicine

## 2019-08-26 DIAGNOSIS — E039 Hypothyroidism, unspecified: Secondary | ICD-10-CM | POA: Insufficient documentation

## 2019-08-26 DIAGNOSIS — I10 Essential (primary) hypertension: Secondary | ICD-10-CM | POA: Insufficient documentation

## 2019-08-26 DIAGNOSIS — Z79899 Other long term (current) drug therapy: Secondary | ICD-10-CM | POA: Diagnosis not present

## 2019-08-26 DIAGNOSIS — Z7984 Long term (current) use of oral hypoglycemic drugs: Secondary | ICD-10-CM | POA: Diagnosis not present

## 2019-08-26 DIAGNOSIS — I774 Celiac artery compression syndrome: Secondary | ICD-10-CM | POA: Diagnosis not present

## 2019-08-26 DIAGNOSIS — K449 Diaphragmatic hernia without obstruction or gangrene: Secondary | ICD-10-CM | POA: Diagnosis not present

## 2019-08-26 DIAGNOSIS — R0789 Other chest pain: Secondary | ICD-10-CM | POA: Diagnosis not present

## 2019-08-26 DIAGNOSIS — I491 Atrial premature depolarization: Secondary | ICD-10-CM | POA: Diagnosis not present

## 2019-08-26 DIAGNOSIS — E119 Type 2 diabetes mellitus without complications: Secondary | ICD-10-CM | POA: Insufficient documentation

## 2019-08-26 DIAGNOSIS — R0902 Hypoxemia: Secondary | ICD-10-CM | POA: Diagnosis not present

## 2019-08-26 DIAGNOSIS — R079 Chest pain, unspecified: Secondary | ICD-10-CM | POA: Diagnosis not present

## 2019-08-26 DIAGNOSIS — Z87891 Personal history of nicotine dependence: Secondary | ICD-10-CM | POA: Diagnosis not present

## 2019-08-26 DIAGNOSIS — R000 Tachycardia, unspecified: Secondary | ICD-10-CM | POA: Diagnosis not present

## 2019-08-26 LAB — CBC
HCT: 43.3 % (ref 36.0–46.0)
Hemoglobin: 13.6 g/dL (ref 12.0–15.0)
MCH: 26.5 pg (ref 26.0–34.0)
MCHC: 31.4 g/dL (ref 30.0–36.0)
MCV: 84.4 fL (ref 80.0–100.0)
Platelets: 186 10*3/uL (ref 150–400)
RBC: 5.13 MIL/uL — ABNORMAL HIGH (ref 3.87–5.11)
RDW: 14.8 % (ref 11.5–15.5)
WBC: 11.6 10*3/uL — ABNORMAL HIGH (ref 4.0–10.5)
nRBC: 0 % (ref 0.0–0.2)

## 2019-08-26 LAB — BASIC METABOLIC PANEL
Anion gap: 12 (ref 5–15)
BUN: 12 mg/dL (ref 8–23)
CO2: 29 mmol/L (ref 22–32)
Calcium: 9.4 mg/dL (ref 8.9–10.3)
Chloride: 95 mmol/L — ABNORMAL LOW (ref 98–111)
Creatinine, Ser: 0.56 mg/dL (ref 0.44–1.00)
GFR calc Af Amer: >60 mL/min (ref >60)
GFR calc non Af Amer: >60 mL/min (ref >60)
Glucose, Bld: 189 mg/dL — ABNORMAL HIGH (ref 70–99)
Potassium: 3.7 mmol/L (ref 3.5–5.1)
Sodium: 136 mmol/L (ref 135–145)

## 2019-08-26 LAB — TROPONIN I (HIGH SENSITIVITY)
Troponin I (High Sensitivity): 8 ng/L (ref <18)
Troponin I (High Sensitivity): 9 ng/L (ref <18)

## 2019-08-26 MED ORDER — IOHEXOL 350 MG/ML SOLN
100.0000 mL | Freq: Once | INTRAVENOUS | Status: AC | PRN
  Administered 2019-08-26: 100 mL via INTRAVENOUS

## 2019-08-26 MED ORDER — FENTANYL CITRATE (PF) 100 MCG/2ML IJ SOLN
12.5000 ug | Freq: Once | INTRAMUSCULAR | Status: AC
  Administered 2019-08-26: 12.5 ug via INTRAVENOUS
  Filled 2019-08-26: qty 2

## 2019-08-26 NOTE — Discharge Instructions (Signed)
Your chest pain is likely due to a hiatal hernia. We have monitored you here and did not find a problem with your heart that could be causing your symptoms. You may continue to use tylenol for pain. If your pain worsens or you develop shortness of breath, then please do not hesitate to return to the emergency room. It is important that you follow up with your primary care physician as soon as possible and schedule an appointment with a general surgeon to discuss further management of your hiatal hernia.

## 2019-08-26 NOTE — ED Notes (Signed)
Pt to CT

## 2019-08-26 NOTE — ED Triage Notes (Addendum)
Pt brought in from home by [facility] with constant, achy chest pain that started yesterday afternoon. Pt also c/o bilateral shoulder and neck pain. Pain with breathing. Pain is reproducible. Pt 92-94% on RA with EMS & pt was placed on 2L O2 via [place]. No diaphoresis. Anxious. VSS. EKG shows ST with occasional PAC, HR 110. 20g IV in right A/C. 4 baby aspirin given by EMS.

## 2019-08-26 NOTE — ED Provider Notes (Signed)
[facility] EMERGENCY DEPARTMENT Provider Note   CSN: [id number] Arrival date & time: 08/26/19  4235     History Chief Complaint  Patient presents with  . Chest Pain    Natalie Long is a 84 y.o. female here with chest tightness since 4 pm yesterday. She has a PMH of T2DM, HTN, hypothyroidism, osteoporosis, depression/anxiety. Patient reports that she was sitting in her chair at home when she started to feel a tightness in her chest. She thought it was due to heart burn so did not think too much about it, but throughout the evening and night it persisted and also seemed to radiate to her back and between her shoulders. She could not get comfortable. She reports some shortness of breath as the pain worsened with deep breathing. She reports no hx of heart issues before and she has never had pain like this. She denies any nausea, vomiting, diaphoresis, abdominal pain. She reports she took all of her medication this morning, except she has not had her BP medications. She also reports that's he took 4 baby aspirins after talking with EMS this morning.   Past Medical History:  Diagnosis Date  . Anxiety   . Arthritis   . Back pain   . Constipation   . Cystocele   . Depression   . Diabetes mellitus without complication (HCC)   . Gastric reflux   . Hyperlipidemia   . Hypertension   . Rectocele   . Thyroid disease     Patient Active Problem List   Diagnosis Date Noted  . Constipation, chronic 08/09/2012  . Osteoporosis, unspecified 07/13/2012  . Callus of foot 05/12/2012  . Hypothyroidism 03/12/2012  . GASTROPARESIS 02/14/2009  . INSOMNIA 07/18/2008  . DIABETES MELLITUS, TYPE II, CONTROLLED, W/NEURO COMPS 03/30/2008  . ANXIETY 06/06/2006  . DEPRESSION 06/06/2006  . MACULAR DEGENERATION 06/06/2006  . HYPERTENSION 06/06/2006  . GERD 06/06/2006  . CONSTIPATION NOS 06/06/2006  . IBS 06/06/2006  . ARTHRITIS 06/06/2006  . LOW BACK PAIN 06/06/2006    Past Surgical History:   Procedure Laterality Date  . ABDOMINAL HYSTERECTOMY    . CATARACT EXTRACTION  2004 and 2008  . COLONOSCOPY  2004   Dr Rayann Heman  . COLONOSCOPY N/A 08/12/2012   Procedure: COLONOSCOPY;  Surgeon: Corbin Ade, MD;  Location: [facility];  Service: Endoscopy;  Laterality: N/A;  11:30  . ESOPHAGOGASTRODUODENOSCOPY  01/01/2009   TIR:[id number] hiatal hernia.Normal esophagus.Antral nodule, status post biopsy     OB History    Gravida  1   Para  1   Term  1   Preterm      AB      Living  1     SAB      TAB      Ectopic      Multiple      Live Births              Family History  Problem Relation Age of Onset  . Osteoporosis Mother   . High blood pressure Mother   . Heart disease Father   . Hyperlipidemia Father   . Hypertension Father   . Diabetes Father   . High blood pressure Father   . Depression Sister   . Cancer Sister        metastatic  . Diabetes Sister   . Colon cancer Sister     Social History   Tobacco Use  . Smoking status: Former Games developer  . Smokeless tobacco:  Never Used  Substance Use Topics  . Alcohol use: No  . Drug use: No    Home Medications Prior to Admission medications   Medication Sig Start Date End Date Taking? Authorizing Provider  ALPRAZolam Prudy Feeler) 0.5 MG tablet Take 0.5 mg by mouth at bedtime.  12/24/17   [provider]  amLODipine-olmesartan (AZOR) 5-20 MG tablet Take 1 tablet by mouth daily.  06/18/15   [provider]  Calcium Carbonate-Vitamin D (CALCIUM 600+D) 600-400 MG-UNIT per tablet Take 2 tablets by mouth daily.    [provider]  clobetasol cream (TEMOVATE) 0.05 % APPLY TO AFFECTED AREAS TWICE DAILY. 04/04/19   Lazaro Arms, MD  diclofenac sodium (VOLTAREN) 1 % GEL Apply 4 g topically 4 (four) times daily. 12/08/17   Darreld Mclean, MD  hydrochlorothiazide (HYDRODIURIL) 25 MG tablet Take 1 tablet (25 mg total) by mouth daily. 10/06/12   [name], MD   HYDROcodone-acetaminophen (NORCO/VICODIN) 5-325 MG tablet Take 1-2 tablets by mouth every 6 (six) hours as needed for moderate pain.  06/23/15   [provider]  levothyroxine (SYNTHROID, LEVOTHROID) 75 MCG tablet Take 75 mcg by mouth daily before breakfast.    [provider]  metFORMIN (GLUCOPHAGE) 500 MG tablet Take 500 mg by mouth 2 (two) times daily with a meal.    [provider]  Multiple Vitamins-Minerals (VISION-VITE PRESERVE PO) Take by mouth.    [provider]  omeprazole (PRILOSEC) 20 MG capsule Take 20 mg by mouth daily.  06/18/15   [provider]  ONE TOUCH ULTRA TEST test strip  11/03/17   [provider]  predniSONE (DELTASONE) 10 MG tablet Take 10 mg by mouth daily with breakfast.  12/25/17   [provider]  traMADol (ULTRAM) 50 MG tablet Take 50 mg by mouth daily as needed for moderate pain or severe pain.  06/18/15   [provider]    Allergies    Codeine  Review of Systems   Review of Systems  Constitutional: Negative for chills, diaphoresis and fever.  HENT: Negative for congestion and sore throat.   Eyes: Negative for visual disturbance.  Respiratory: Positive for chest tightness and shortness of breath. Negative for cough.   Cardiovascular: Positive for chest pain. Negative for palpitations and leg swelling.  Gastrointestinal: Negative for abdominal pain, blood in stool, constipation, diarrhea, nausea and vomiting.  Neurological: Negative for dizziness, weakness, light-headedness and headaches.    Physical Exam Updated Vital Signs BP (!) 150/82   Pulse (!) 103   Temp 99.5 F (37.5 C) (Oral)   Resp (!) 23   Ht 5\' 7"  (1.702 m)   Wt 79.8 kg   SpO2 94%   BMI 27.57 kg/m   Physical Exam Vitals and nursing note reviewed.  Constitutional:      General: She is not in acute distress.    Appearance: She is well-developed and normal weight.  HENT:     Head: Normocephalic and atraumatic.   Eyes:     Extraocular Movements: Extraocular movements intact.     Pupils: Pupils are equal, round, and reactive to light.  Cardiovascular:     Rate and Rhythm: Normal rate and regular rhythm.     Heart sounds: Normal heart sounds. No murmur.  Pulmonary:     Effort: Pulmonary effort is normal. No respiratory distress.     Breath sounds: Normal breath sounds.  Abdominal:     Palpations: Abdomen is soft.  Musculoskeletal:  General: Normal range of motion.     Cervical back: Normal range of motion and neck supple.     Right lower leg: No edema.     Left lower leg: No edema.  Skin:    General: Skin is warm.     Capillary Refill: Capillary refill takes less than 2 seconds.  Neurological:     General: No focal deficit present.     Mental Status: She is alert and oriented to person, place, and time.  Psychiatric:        Mood and Affect: Mood normal.        Behavior: Behavior normal.     ED Results / Procedures / Treatments   Labs (all labs ordered are listed, but only abnormal results are displayed) Labs Reviewed  CBC - Abnormal; Notable for the following components:      Result Value   WBC 11.6 (*)    RBC 5.13 (*)    All other components within normal limits  BASIC METABOLIC PANEL  TROPONIN I (HIGH SENSITIVITY)    EKG None  Radiology DG Chest Port 1 View  Result Date: 08/26/2019 CLINICAL DATA:  Chest pain EXAM: PORTABLE CHEST 1 VIEW COMPARISON:  June 26, 2015 FINDINGS: There is atelectatic change in the left base with minimal left pleural effusion. Lungs elsewhere clear. Heart size and pulmonary vascular within normal limits. There is a sizable hiatal type hernia. There is aortic atherosclerosis. No adenopathy. No bone lesions. IMPRESSION: Left base atelectasis with minimal left pleural effusion. Lungs elsewhere clear. Stable cardiac silhouette. Moderate hiatal hernia. Aortic Atherosclerosis (ICD10-I70.0). Electronically Signed   By: Lowella Grip III M.D.   On:  08/26/2019 09:47    Procedures Procedures (including critical care time)  Medications Ordered in ED Medications  fentaNYL (SUBLIMAZE) injection 12.5 mcg (12.5 mcg Intravenous Given 08/26/19 1019)    ED Course  I have reviewed the triage vital signs and the nursing notes.  Pertinent labs & imaging results that were available during my care of the patient were reviewed by me and considered in my medical decision making (see chart for details).    MDM Rules/Calculators/A&P                      84 yo female with PMH HTN, T2DM here with chest tightness since 4 pm yesterday. No DoE, some pain with inspiration. Initial hsTrop was wnl at 8. Patient given fentanyl for pain with mild relief. Patient with HEART score of 4. EKG with no signs of St elevation or depressions, regular rhythm. No prior hx of MI. Ordered CTA chest/abd/pel of aorta to rule out dissection given radiation of pain to back. CXR shows moderate hiatal hernia but no other acute abnormalities.   CTA showed no aortic aneurysm or dissection. No PE. Large hiatal hernia. Cholelithiasis without thickened gallbladder wall noted. Will continue to monitor hsTrop for 2 hours to ensure no cardiac etiology given patient's moderate risk.   Second hsTrop also wnl at 9. Patient's likely cause of symptoms is from the large hiatal hernia. She will need to follow up with surgery outpatient. She is to take tylenol for pain.     Patient to be discharged home with strict return precautions.  AZA DANTES was evaluated in Emergency Department on 08/26/2019 for the symptoms described in the history of present illness. She was evaluated in the context of the global COVID-19 pandemic, which necessitated consideration that the patient might be at risk for infection  with the SARS-CoV-2 virus that causes COVID-19. Institutional protocols and algorithms that pertain to the evaluation of patients at risk for COVID-19 are in a state of rapid change based on  information released by regulatory bodies including the CDC and federal and state organizations. These policies and algorithms were followed during the patient's care in the ED. Final Clinical Impression(s) / ED Diagnoses Final diagnoses:  None    Rx / DC Orders ED Discharge Orders    None       Shaquoia Miers, [place], DO 08/26/19 1349    Blane Ohara, MD 08/27/19 1544

## 2019-08-27 ENCOUNTER — Ambulatory Visit
Admission: EM | Admit: 2019-08-27 | Discharge: 2019-08-27 | Disposition: A | Discharge status: Home / Self Care | Payer: Medicare Other | Attending: Emergency Medicine | Admitting: Emergency Medicine

## 2019-08-27 ENCOUNTER — Encounter: Payer: Self-pay | Admitting: Emergency Medicine

## 2019-08-27 ENCOUNTER — Other Ambulatory Visit: Payer: Self-pay

## 2019-08-27 DIAGNOSIS — G8929 Other chronic pain: Secondary | ICD-10-CM | POA: Diagnosis not present

## 2019-08-27 DIAGNOSIS — N3001 Acute cystitis with hematuria: Secondary | ICD-10-CM

## 2019-08-27 DIAGNOSIS — M25512 Pain in left shoulder: Secondary | ICD-10-CM | POA: Diagnosis not present

## 2019-08-27 LAB — POCT URINALYSIS DIP (MANUAL ENTRY)
Glucose, UA: NEGATIVE mg/dL
Ketones, POC UA: NEGATIVE mg/dL
Nitrite, UA: NEGATIVE
Protein Ur, POC: 100 mg/dL — AB
Spec Grav, UA: >=1.03 — AB (ref 1.010–1.025)
Urobilinogen, UA: 1 E.U./dL
pH, UA: 5 (ref 5.0–8.0)

## 2019-08-27 MED ORDER — DICLOFENAC SODIUM 1 % EX GEL: CUTANEOUS | 0 refills | Status: DC

## 2019-08-27 MED ORDER — METHYLPREDNISOLONE SODIUM SUCC 125 MG IJ SOLR
80.0000 mg | Freq: Once | INTRAMUSCULAR | Status: AC
  Administered 2019-08-27: 80 mg via INTRAMUSCULAR

## 2019-08-27 MED ORDER — NITROFURANTOIN MONOHYD MACRO 100 MG PO CAPS: 100.0000 mg | ORAL_CAPSULE | Freq: Two times a day (BID) | ORAL | 0 refills | Status: DC

## 2019-08-27 NOTE — Discharge Instructions (Addendum)
Was advised to take antibiotic as prescribed and to completion To use diclofenac gel for shoulder pain To follow-up with primary care Return or go to ED for worsening of symptoms

## 2019-08-27 NOTE — ED Provider Notes (Signed)
[facility]    CSN: [id number] Arrival date & time: 08/27/19  1132      History   Chief Complaint Chief Complaint  Patient presents with  . Urinary Tract Infection  . Shoulder Injury    HPI Natalie Long is a 84 y.o. female.   Who presented to the urgent care for complaint of increased urine frequency for the past 3 weeks.  She denies a precipitating event or recent sexual encounter.  Has not tried any medication.  Her symptoms are made worse with urination.  She reports similar symptoms in the past that improved with antibiotic treatment.  She complains of decreased amount, increased urgency.  She denies fever, chills, nausea, vomiting, abdominal pain, flank pain, hematuria, or incontinence  Patient also reports chronic left shoulder pain.  Has not used any OTC medication.  Rated pain at 10 on a scale 1-10.  Reports similar symptoms in the past that improved with prednisone IM.  Denies chills, fever, nausea, vomiting.  Denies injury or trauma.     Past Medical History:  Diagnosis Date  . Anxiety   . Arthritis   . Back pain   . Constipation   . Cystocele   . Depression   . Diabetes mellitus without complication (HCC)   . Gastric reflux   . Hyperlipidemia   . Hypertension   . Rectocele   . Thyroid disease     Patient Active Problem List   Diagnosis Date Noted  . Constipation, chronic 08/09/2012  . Osteoporosis, unspecified 07/13/2012  . Callus of foot 05/12/2012  . Hypothyroidism 03/12/2012  . GASTROPARESIS 02/14/2009  . INSOMNIA 07/18/2008  . DIABETES MELLITUS, TYPE II, CONTROLLED, W/NEURO COMPS 03/30/2008  . ANXIETY 06/06/2006  . DEPRESSION 06/06/2006  . MACULAR DEGENERATION 06/06/2006  . HYPERTENSION 06/06/2006  . GERD 06/06/2006  . CONSTIPATION NOS 06/06/2006  . IBS 06/06/2006  . ARTHRITIS 06/06/2006  . LOW BACK PAIN 06/06/2006    Past Surgical History:  Procedure Laterality Date  . ABDOMINAL HYSTERECTOMY    . CATARACT EXTRACTION   2004 and 2008  . COLONOSCOPY  2004   Dr Rayann Heman  . COLONOSCOPY N/A 08/12/2012   Procedure: COLONOSCOPY;  Surgeon: Corbin Ade, MD;  Location: [facility];  Service: Endoscopy;  Laterality: N/A;  11:30  . ESOPHAGOGASTRODUODENOSCOPY  01/01/2009   OIB:[id number] hiatal hernia.Normal esophagus.Antral nodule, status post biopsy    OB History    Gravida  1   Para  1   Term  1   Preterm      AB      Living  1     SAB      TAB      Ectopic      Multiple      Live Births               Home Medications    Prior to Admission medications   Medication Sig Start Date End Date Taking? Authorizing Provider  amLODipine-olmesartan (AZOR) 5-20 MG tablet Take 1 tablet by mouth daily.  06/18/15  Yes [provider]  hydrochlorothiazide (HYDRODIURIL) 25 MG tablet Take 1 tablet (25 mg total) by mouth daily. 10/06/12  Yes [place], Velna Hatchet, MD  levothyroxine (SYNTHROID, LEVOTHROID) 75 MCG tablet Take 75 mcg by mouth daily before breakfast.   Yes [provider]  metFORMIN (GLUCOPHAGE) 500 MG tablet Take 500 mg by mouth 2 (two) times daily with a meal.   Yes [provider]  ALPRAZolam Prudy Feeler)  0.5 MG tablet Take 0.5 mg by mouth at bedtime.  12/24/17   [provider]  Calcium Carbonate-Vitamin D (CALCIUM 600+D) 600-400 MG-UNIT per tablet Take 2 tablets by mouth daily.    [provider]  clobetasol cream (TEMOVATE) 0.05 % APPLY TO AFFECTED AREAS TWICE DAILY. 04/04/19   Lazaro ArmsEure, Luther H, MD  diclofenac Sodium (VOLTAREN) 1 % GEL Apply small amount to the affected area (left shoulder) as needed for pain 08/27/19   [place], Zachery DakinsKomlanvi S, FNP  HYDROcodone-acetaminophen (NORCO/VICODIN) 5-325 MG tablet Take 1-2 tablets by mouth every 6 (six) hours as needed for moderate pain.  06/23/15   [provider]  Multiple Vitamins-Minerals (VISION-VITE PRESERVE PO) Take by mouth.    [provider]  nitrofurantoin, macrocrystal-monohydrate,  (MACROBID) 100 MG capsule Take 1 capsule (100 mg total) by mouth 2 (two) times daily. 08/27/19   [place], Zachery DakinsKomlanvi S, FNP  omeprazole (PRILOSEC) 20 MG capsule Take 20 mg by mouth daily.  06/18/15   [provider]  ONE TOUCH ULTRA TEST test strip  11/03/17   [provider]  predniSONE (DELTASONE) 10 MG tablet Take 10 mg by mouth daily with breakfast.  12/25/17   [provider]  traMADol (ULTRAM) 50 MG tablet Take 50 mg by mouth daily as needed for moderate pain or severe pain.  06/18/15   [provider]    Family History Family History  Problem Relation Age of Onset  . Osteoporosis Mother   . High blood pressure Mother   . Heart disease Father   . Hyperlipidemia Father   . Hypertension Father   . Diabetes Father   . High blood pressure Father   . Depression Sister   . Cancer Sister        metastatic  . Diabetes Sister   . Colon cancer Sister     Social History Social History   Tobacco Use  . Smoking status: Former Games developermoker  . Smokeless tobacco: Never Used  Substance Use Topics  . Alcohol use: No  . Drug use: No     Allergies   Codeine   Review of Systems Review of Systems  Constitutional: Negative.   Respiratory: Negative.   Cardiovascular: Negative.   Genitourinary: Positive for frequency and urgency.  Musculoskeletal: Positive for arthralgias.  All other systems reviewed and are negative.    Physical Exam Triage Vital Signs ED Triage Vitals  Enc Vitals Group     BP 08/27/19 1148 114/64     Pulse Rate 08/27/19 1148 (!) 101     Resp 08/27/19 1148 (!) 26     Temp 08/27/19 1148 98.8 F (37.1 C)     Temp Source 08/27/19 1148 Oral     SpO2 08/27/19 1148 95 %     Weight --      Height --      Head Circumference --      Peak Flow --      Pain Score 08/27/19 1144 10     Pain Loc --      Pain Edu? --      Excl. in GC? --    No data found.  Updated Vital Signs BP 114/64 (BP Location: Right Arm)   Pulse (!) 101   Temp  98.8 F (37.1 C) (Oral)   Resp (!) 26   SpO2 95%   Visual Acuity Right Eye Distance:   Left Eye Distance:   Bilateral Distance:    Right Eye Near:   Left Eye  Near:    Bilateral Near:     Physical Exam Vitals and nursing note reviewed.  Constitutional:      General: She is not in acute distress.    Appearance: Normal appearance. She is normal weight. She is not ill-appearing or toxic-appearing.  Cardiovascular:     Rate and Rhythm: Normal rate and regular rhythm.     Pulses: Normal pulses.     Heart sounds: Normal heart sounds. No murmur. No gallop.   Pulmonary:     Effort: Pulmonary effort is normal. No respiratory distress.     Breath sounds: Normal breath sounds. No stridor. No wheezing, rhonchi or rales.  Chest:     Chest wall: No tenderness.  Musculoskeletal:        General: Tenderness present.     Right shoulder: Normal.     Left shoulder: Tenderness present.     Comments: Chronic shoulder pain.  The right shoulder is without any obvious asymmetry or deformity when compared to the left shoulder.  No surface trauma, erythema, open wound, warmth present.  Limited range of motion of the left shoulder due to pain.  Neurovascular status intact  Neurological:     Mental Status: She is alert.      UC Treatments / Results  Labs (all labs ordered are listed, but only abnormal results are displayed) Labs Reviewed  POCT URINALYSIS DIP (MANUAL ENTRY) - Abnormal; Notable for the following components:      Result Value   Bilirubin, UA moderate (*)    Spec Grav, UA >=1.030 (*)    Blood, UA trace-lysed (*)    Protein Ur, POC =100 (*)    Leukocytes, UA Trace (*)    All other components within normal limits  URINE CULTURE    EKG   Radiology DG Chest Port 1 View  Result Date: 08/26/2019 CLINICAL DATA:  Chest pain EXAM: PORTABLE CHEST 1 VIEW COMPARISON:  June 26, 2015 FINDINGS: There is atelectatic change in the left base with minimal left pleural effusion. Lungs  elsewhere clear. Heart size and pulmonary vascular within normal limits. There is a sizable hiatal type hernia. There is aortic atherosclerosis. No adenopathy. No bone lesions. IMPRESSION: Left base atelectasis with minimal left pleural effusion. Lungs elsewhere clear. Stable cardiac silhouette. Moderate hiatal hernia. Aortic Atherosclerosis (ICD10-I70.0). Electronically Signed   By: Bretta Bang III M.D.   On: 08/26/2019 09:47   CT Angio Chest/Abd/Pel for Dissection W and/or Wo Contrast  Result Date: 08/26/2019 CLINICAL DATA:  Chest and abdominal pain EXAM: CT ANGIOGRAPHY CHEST, ABDOMEN AND PELVIS TECHNIQUE: Initially, axial CT images were obtained through the chest without intravenous contrast material administration. Multidetector CT imaging through the chest, abdomen and pelvis was performed using the standard protocol during bolus administration of intravenous contrast. Multiplanar reconstructed images and MIPs were obtained and reviewed to evaluate the vascular anatomy. CONTRAST:  OMNIPAQUE IOHEXOL 350 MG/ML SOLN COMPARISON:  Chest radiograph August 26, 2019 FINDINGS: CTA CHEST FINDINGS Cardiovascular: There is no intramural hematoma seen within the thoracic aorta on noncontrast enhanced study. There is no evident mediastinal hematoma. There is no evident thoracic aortic aneurysm or dissection. There are occasional foci of great vessel calcifications. Visualized great vessels otherwise appear normal. There is aortic atherosclerosis. No hemodynamically significant obstruction noted in the aorta or visualized great vessels. There are multiple foci of coronary artery calcification. There is a minimal pericardial effusion which may be within physiologic range. No pericardial thickening. No demonstrable pulmonary embolus. Mediastinum/Nodes: Thyroid appears normal.  No evident thoracic adenopathy. There is a fairly sizable hiatal type hernia. Lungs/Pleura: There is slight scarring in the apices. There  is no evident edema or airspace opacity. There is atelectatic change in the lateral left base. There is a rather minimal left pleural effusion. Musculoskeletal: There are foci of degenerative change in the thoracic spine. No evident fracture or dislocation. No blastic or lytic bone lesions evident. No chest wall lesions. Review of the MIP images confirms the above findings. CTA ABDOMEN AND PELVIS FINDINGS VASCULAR Aorta: There is no abdominal aortic aneurysm or dissection. There is calcification along the wall of the abdominal aorta along its course fairly diffusely. No hemodynamically significant obstruction evident. Celiac: There is atherosclerotic plaque at the origin of the celiac artery. There is localized 75-80% diameter stenosis at the origin of the celiac artery. Scattered calcifications are noted in the splenic artery. No hemodynamically significant obstruction is seen in the branches of the celiac artery. No aneurysm or dissection involving these vessels. SMA: There is 40-50% diameter stenosis at the origin of the superior mesenteric artery. Other major branches of the superior mesenteric artery appear patent. No aneurysm or dissection involving these vessels is noted. Renals: There is a single renal artery on each side. There is calcification at the origin of each renal artery with less than 50% diameter stenosis in these areas. No other narrowing noted in either renal artery or branches. No aneurysm, dissection, or fibromuscular dysplasia noted in either renal artery or branches. IMA: The inferior mesenteric artery is diminutive but patent. The branches of the inferior mesenteric artery are patent without appreciable obstructive disease. No aneurysm or dissection involving these vessels is evident. Inflow: There is calcification in each proximal common iliac artery. There is less than 50% diameter stenosis in each common iliac artery. Scattered foci of calcification are noted in the right common iliac  artery with less than 50% diameter stenosis. Both external iliac arteries are widely patent. There is calcification in the distal left common femoral artery with less than 50% diameter stenosis in this area. The right common femoral artery is widely patent. There is modest calcification at the origin of the right superficial femoral artery. Other visualized superficial and profunda femoral arteries are patent. No aneurysm or dissection seen involving major pelvic arterial vessels. Veins: No obvious venous abnormality within the limitations of this arterial phase study. Review of the MIP images confirms the above findings. NON-VASCULAR Hepatobiliary: No focal liver lesions are evident. There is cholelithiasis. The gallbladder wall is not appreciably thickened. No biliary duct dilatation. Pancreas: No pancreatic mass or inflammatory focus. Spleen: No splenic lesions are appreciable. Adrenals/Urinary Tract: Adrenals bilaterally appear normal. There is a cyst arising from the lower pole left kidney measuring 1.9 x 1.8 cm. There is no demonstrable hydronephrosis on either side. There is no evident renal or ureteral calculus on either side. Urinary bladder is midline with wall thickness within normal limits. Stomach/Bowel: There is fairly diffuse stool throughout colon. There are multiple sigmoid diverticula without diverticulitis. There is apparent muscular hypertrophy in the sigmoid colon due to chronic diverticulosis. There is no appreciable bowel wall or mesenteric thickening. There is no evident bowel obstruction. The terminal ileum appears normal. There is no evident free air or portal venous air. Lymphatic: There is no adenopathy in the abdomen or pelvis. Reproductive: Uterus is absent.  No evident pelvic mass. Other: No appendiceal region inflammation. No abscess or ascites evident in the abdomen or pelvis. Musculoskeletal: There is degenerative change in the  lumbar spine. No blastic or lytic bone lesions are  evident. There is moderate spinal stenosis at L3-4 due to bony hypertrophy and diffuse disc protrusion. No intramuscular or abdominal wall lesions are evident. Review of the MIP images confirms the above findings. IMPRESSION: IMPRESSION CT angiogram chest: 1. No thoracic aortic aneurysm or dissection. No pulmonary embolus. There are foci of aortic atherosclerosis. There are occasional foci of great vessel calcification. There are multiple foci of coronary artery calcification. No evident pulmonary embolus. 2. Anterior left base atelectasis. No lung base edema or airspace opacity. 3.  Large hiatal type hernia. 4.  No evident adenopathy. CT angiogram abdomen; CT angiogram pelvis: 1. No aneurysm or dissection involving abdominal aorta, major mesenteric, and major pelvic arterial vessels. No fibromuscular dysplasia. 2. Areas of patchy atherosclerotic calcification throughout the aorta as well as at several sites in mesenteric and pelvic arterial vessels. There is 75- 80% diameter stenosis at the origin of the celiac artery. Approximately 40-50% diameter stenosis is noted in the proximal superior mesenteric artery. Other areas showing atherosclerosis have less than 50% diameter obstruction. 3. Cholelithiasis. Gallbladder wall does not appear thickened by CT. 4. Sigmoid diverticula without frank diverticulitis. No bowel obstruction. No abscess in the abdomen or pelvis. No periappendiceal region inflammation. 5. Moderate spinal stenosis at L3-4 due to bony hypertrophy and diffuse disc protrusion. 6.  Uterus absent. Aortic Atherosclerosis (ICD10-I70.0). Electronically Signed   By: Bretta Bang III M.D.   On: 08/26/2019 12:04    Procedures Procedures (including critical care time)  Medications Ordered in UC Medications  methylPREDNISolone sodium succinate (SOLU-MEDROL) 125 mg/2 mL injection 80 mg (80 mg Intramuscular Given 08/27/19 1214)    Initial Impression / Assessment and Plan / UC Course  I have reviewed  the triage vital signs and the nursing notes.  Pertinent labs & imaging results that were available during my care of the patient were reviewed by me and considered in my medical decision making (see chart for details).   POCT urine analysis showed trace of lysed RBC with trace of leukocyte. She is unable to provide more urine for culture.  Therefore will be treated for possible UTI.  Solu-Medrol IM was given in office Was advised to follow-up with primary care Return for worsening of symptoms   Final Clinical Impressions(s) / UC Diagnoses   Final diagnoses:  Chronic pain in left shoulder  Acute cystitis with hematuria     Discharge Instructions     Was advised to take antibiotic as prescribed and to completion To use diclofenac gel for shoulder pain To follow-up with primary care Return or go to ED for worsening of symptoms    ED Prescriptions    Medication Sig Dispense Auth. Provider   nitrofurantoin, macrocrystal-monohydrate, (MACROBID) 100 MG capsule Take 1 capsule (100 mg total) by mouth 2 (two) times daily. 10 capsule Kemond Amorin S, FNP   diclofenac Sodium (VOLTAREN) 1 % GEL  (Status: Discontinued) Apply small amount to the affected area as needed for pain 4 g Maisy [place], Zachery Dakins, FNP   diclofenac Sodium (VOLTAREN) 1 % GEL Apply small amount to the affected area (left shoulder) as needed for pain 4 g Shanai Lartigue, Zachery Dakins, FNP     PDMP not reviewed this encounter.   Durward Parcel, FNP 08/27/19 1228

## 2019-08-27 NOTE — ED Triage Notes (Signed)
Left shoulder pain started hurting last night.  Denies fall  Patient complains of frequent urination for 3 weeks.  Denies burning with urination.

## 2019-08-29 DIAGNOSIS — I10 Essential (primary) hypertension: Secondary | ICD-10-CM | POA: Diagnosis not present

## 2019-08-29 DIAGNOSIS — M19012 Primary osteoarthritis, left shoulder: Secondary | ICD-10-CM | POA: Diagnosis not present

## 2019-08-29 DIAGNOSIS — D509 Iron deficiency anemia, unspecified: Secondary | ICD-10-CM | POA: Diagnosis not present

## 2019-08-29 DIAGNOSIS — E039 Hypothyroidism, unspecified: Secondary | ICD-10-CM | POA: Diagnosis not present

## 2019-09-21 DIAGNOSIS — E119 Type 2 diabetes mellitus without complications: Secondary | ICD-10-CM | POA: Diagnosis not present

## 2019-09-21 DIAGNOSIS — E1142 Type 2 diabetes mellitus with diabetic polyneuropathy: Secondary | ICD-10-CM | POA: Diagnosis not present

## 2019-09-21 DIAGNOSIS — L84 Corns and callosities: Secondary | ICD-10-CM | POA: Diagnosis not present

## 2019-11-01 DIAGNOSIS — E1142 Type 2 diabetes mellitus with diabetic polyneuropathy: Secondary | ICD-10-CM | POA: Diagnosis not present

## 2019-11-01 DIAGNOSIS — D519 Vitamin B12 deficiency anemia, unspecified: Secondary | ICD-10-CM | POA: Diagnosis not present

## 2019-11-01 DIAGNOSIS — E039 Hypothyroidism, unspecified: Secondary | ICD-10-CM | POA: Diagnosis not present

## 2019-11-01 DIAGNOSIS — D509 Iron deficiency anemia, unspecified: Secondary | ICD-10-CM | POA: Diagnosis not present

## 2019-11-01 DIAGNOSIS — E1121 Type 2 diabetes mellitus with diabetic nephropathy: Secondary | ICD-10-CM | POA: Diagnosis not present

## 2019-11-01 DIAGNOSIS — E1140 Type 2 diabetes mellitus with diabetic neuropathy, unspecified: Secondary | ICD-10-CM | POA: Diagnosis not present

## 2019-11-04 DIAGNOSIS — K219 Gastro-esophageal reflux disease without esophagitis: Secondary | ICD-10-CM | POA: Diagnosis not present

## 2019-11-04 DIAGNOSIS — Z0001 Encounter for general adult medical examination with abnormal findings: Secondary | ICD-10-CM | POA: Diagnosis not present

## 2019-11-07 DIAGNOSIS — Z0001 Encounter for general adult medical examination with abnormal findings: Secondary | ICD-10-CM | POA: Diagnosis not present

## 2019-11-07 DIAGNOSIS — D519 Vitamin B12 deficiency anemia, unspecified: Secondary | ICD-10-CM | POA: Diagnosis not present

## 2019-11-07 DIAGNOSIS — E119 Type 2 diabetes mellitus without complications: Secondary | ICD-10-CM | POA: Diagnosis not present

## 2019-11-17 DIAGNOSIS — S93331D Other subluxation of right foot, subsequent encounter: Secondary | ICD-10-CM | POA: Diagnosis not present

## 2019-11-17 DIAGNOSIS — I739 Peripheral vascular disease, unspecified: Secondary | ICD-10-CM | POA: Diagnosis not present

## 2019-11-17 DIAGNOSIS — S93332D Other subluxation of left foot, subsequent encounter: Secondary | ICD-10-CM | POA: Diagnosis not present

## 2019-11-17 DIAGNOSIS — L110 Acquired keratosis follicularis: Secondary | ICD-10-CM | POA: Diagnosis not present

## 2019-11-17 DIAGNOSIS — M79671 Pain in right foot: Secondary | ICD-10-CM | POA: Diagnosis not present

## 2019-12-06 DIAGNOSIS — D519 Vitamin B12 deficiency anemia, unspecified: Secondary | ICD-10-CM | POA: Diagnosis not present

## 2020-01-12 DIAGNOSIS — D519 Vitamin B12 deficiency anemia, unspecified: Secondary | ICD-10-CM | POA: Diagnosis not present

## 2020-01-25 DIAGNOSIS — M19012 Primary osteoarthritis, left shoulder: Secondary | ICD-10-CM | POA: Diagnosis not present

## 2020-02-02 DIAGNOSIS — E119 Type 2 diabetes mellitus without complications: Secondary | ICD-10-CM | POA: Diagnosis not present

## 2020-02-08 DIAGNOSIS — D519 Vitamin B12 deficiency anemia, unspecified: Secondary | ICD-10-CM | POA: Diagnosis not present

## 2020-02-22 DIAGNOSIS — S93332D Other subluxation of left foot, subsequent encounter: Secondary | ICD-10-CM | POA: Diagnosis not present

## 2020-02-22 DIAGNOSIS — L110 Acquired keratosis follicularis: Secondary | ICD-10-CM | POA: Diagnosis not present

## 2020-02-22 DIAGNOSIS — S93331D Other subluxation of right foot, subsequent encounter: Secondary | ICD-10-CM | POA: Diagnosis not present

## 2020-02-22 DIAGNOSIS — M79671 Pain in right foot: Secondary | ICD-10-CM | POA: Diagnosis not present

## 2020-02-22 DIAGNOSIS — I739 Peripheral vascular disease, unspecified: Secondary | ICD-10-CM | POA: Diagnosis not present

## 2020-03-12 DIAGNOSIS — D519 Vitamin B12 deficiency anemia, unspecified: Secondary | ICD-10-CM | POA: Diagnosis not present

## 2020-03-15 DIAGNOSIS — I10 Essential (primary) hypertension: Secondary | ICD-10-CM | POA: Diagnosis not present

## 2020-03-15 DIAGNOSIS — F5101 Primary insomnia: Secondary | ICD-10-CM | POA: Diagnosis not present

## 2020-03-15 DIAGNOSIS — M816 Localized osteoporosis [Lequesne]: Secondary | ICD-10-CM | POA: Diagnosis not present

## 2020-03-15 DIAGNOSIS — D509 Iron deficiency anemia, unspecified: Secondary | ICD-10-CM | POA: Diagnosis not present

## 2020-03-15 DIAGNOSIS — E119 Type 2 diabetes mellitus without complications: Secondary | ICD-10-CM | POA: Diagnosis not present

## 2020-03-15 DIAGNOSIS — K219 Gastro-esophageal reflux disease without esophagitis: Secondary | ICD-10-CM | POA: Diagnosis not present

## 2020-03-21 DIAGNOSIS — I10 Essential (primary) hypertension: Secondary | ICD-10-CM | POA: Diagnosis not present

## 2020-03-21 DIAGNOSIS — K219 Gastro-esophageal reflux disease without esophagitis: Secondary | ICD-10-CM | POA: Diagnosis not present

## 2020-03-21 DIAGNOSIS — E039 Hypothyroidism, unspecified: Secondary | ICD-10-CM | POA: Diagnosis not present

## 2020-03-21 DIAGNOSIS — E1142 Type 2 diabetes mellitus with diabetic polyneuropathy: Secondary | ICD-10-CM | POA: Diagnosis not present

## 2020-03-21 DIAGNOSIS — G990 Autonomic neuropathy in diseases classified elsewhere: Secondary | ICD-10-CM | POA: Diagnosis not present

## 2020-04-02 ENCOUNTER — Ambulatory Visit (INDEPENDENT_AMBULATORY_CARE_PROVIDER_SITE_OTHER): Payer: Medicare Other

## 2020-04-02 ENCOUNTER — Other Ambulatory Visit: Payer: Self-pay

## 2020-04-02 ENCOUNTER — Encounter: Payer: Self-pay | Admitting: Emergency Medicine

## 2020-04-02 ENCOUNTER — Ambulatory Visit
Admission: EM | Admit: 2020-04-02 | Discharge: 2020-04-02 | Disposition: A | Discharge status: Home / Self Care | Payer: Medicare Other | Attending: Emergency Medicine | Admitting: Emergency Medicine

## 2020-04-02 DIAGNOSIS — S0033XA Contusion of nose, initial encounter: Secondary | ICD-10-CM

## 2020-04-02 DIAGNOSIS — R58 Hemorrhage, not elsewhere classified: Secondary | ICD-10-CM | POA: Diagnosis not present

## 2020-04-02 DIAGNOSIS — W19XXXA Unspecified fall, initial encounter: Secondary | ICD-10-CM

## 2020-04-02 DIAGNOSIS — R609 Edema, unspecified: Secondary | ICD-10-CM

## 2020-04-02 DIAGNOSIS — S022XXA Fracture of nasal bones, initial encounter for closed fracture: Secondary | ICD-10-CM | POA: Diagnosis not present

## 2020-04-02 DIAGNOSIS — S0511XA Contusion of eyeball and orbital tissues, right eye, initial encounter: Secondary | ICD-10-CM | POA: Diagnosis not present

## 2020-04-02 NOTE — ED Provider Notes (Signed)
[facility]   [id number] 04/02/20 Arrival Time: 1823   Chief Complaint  Patient presents with   Fall     SUBJECTIVE: History from: patient and family.  Natalie Long is a 84 y.o. female presented to the urgent care with a complaint of fall that occurred 1 to 2 hours ago.  Reports she fell onto the pavement while checking her mail.  Patient hit her face to the ground.  She localizes the pain to her face.  She describes the pain as constant and achy.  She has tried OTC medications without relief.  Her symptoms are made worse with ROM.  She denies similar symptoms in the past.  Denies chills, fever, nausea, vomiting, diarrhea, confusion, LOC, paresthesia, blurry vision, diplopia.   ROS: As per HPI.  All other pertinent ROS negative.     Past Medical History:  Diagnosis Date   Anxiety    Arthritis    Back pain    Constipation    Cystocele    Depression    Diabetes mellitus without complication (HCC)    Gastric reflux    Hyperlipidemia    Hypertension    Rectocele    Thyroid disease    Past Surgical History:  Procedure Laterality Date   ABDOMINAL HYSTERECTOMY     CATARACT EXTRACTION  2004 and 2008   COLONOSCOPY  2004   Dr Rayann Heman   COLONOSCOPY N/A 08/12/2012   Procedure: COLONOSCOPY;  Surgeon: Corbin Ade, MD;  Location: [facility];  Service: Endoscopy;  Laterality: N/A;  11:30   ESOPHAGOGASTRODUODENOSCOPY  01/01/2009   [id number] hiatal hernia.Normal esophagus.Antral nodule, status post biopsy   Allergies  Allergen Reactions   Codeine Nausea Only   No current facility-administered medications on file prior to encounter.   Current Outpatient Medications on File Prior to Encounter  Medication Sig Dispense Refill   ALPRAZolam (XANAX) 0.5 MG tablet Take 0.5 mg by mouth at bedtime.      amLODipine-olmesartan (AZOR) 5-20 MG tablet Take 1 tablet by mouth daily.      Calcium Carbonate-Vitamin D (CALCIUM 600+D) 600-400  MG-UNIT per tablet Take 2 tablets by mouth daily.     clobetasol cream (TEMOVATE) 0.05 % APPLY TO AFFECTED AREAS TWICE DAILY. 30 g 6   diclofenac Sodium (VOLTAREN) 1 % GEL Apply small amount to the affected area (left shoulder) as needed for pain 4 g 0   hydrochlorothiazide (HYDRODIURIL) 25 MG tablet Take 1 tablet (25 mg total) by mouth daily. 90 tablet 1   HYDROcodone-acetaminophen (NORCO/VICODIN) 5-325 MG tablet Take 1-2 tablets by mouth every 6 (six) hours as needed for moderate pain.      levothyroxine (SYNTHROID, LEVOTHROID) 75 MCG tablet Take 75 mcg by mouth daily before breakfast.     metFORMIN (GLUCOPHAGE) 500 MG tablet Take 500 mg by mouth 2 (two) times daily with a meal.     Multiple Vitamins-Minerals (VISION-VITE PRESERVE PO) Take by mouth.     nitrofurantoin, macrocrystal-monohydrate, (MACROBID) 100 MG capsule Take 1 capsule (100 mg total) by mouth 2 (two) times daily. 10 capsule 0   omeprazole (PRILOSEC) 20 MG capsule Take 20 mg by mouth daily.      ONE TOUCH ULTRA TEST test strip      predniSONE (DELTASONE) 10 MG tablet Take 10 mg by mouth daily with breakfast.      traMADol (ULTRAM) 50 MG tablet Take 50 mg by mouth daily as needed for moderate pain or severe pain.  Social History   Socioeconomic History   Marital status: Widowed    Spouse name: Not on file   Number of children: Not on file   Years of education: Not on file   Highest education level: Not on file  Occupational History   Not on file  Tobacco Use   Smoking status: Former Smoker   Smokeless tobacco: Never Used  Building services engineer Use: Never used  Substance and Sexual Activity   Alcohol use: No   Drug use: No   Sexual activity: Not on file  Other Topics Concern   Not on file  Social History Narrative   Not on file   Social Determinants of Health   Financial Resource Strain:    Difficulty of Paying Living Expenses: Not on file  Food Insecurity:    Worried About  Running Out of Food in the Last Year: Not on file   Ran Out of Food in the Last Year: Not on file  Transportation Needs:    Lack of Transportation (Medical): Not on file   Lack of Transportation (Non-Medical): Not on file  Physical Activity:    Days of Exercise per Week: Not on file   Minutes of Exercise per Session: Not on file  Stress:    Feeling of Stress : Not on file  Social Connections:    Frequency of Communication with Friends and Family: Not on file   Frequency of Social Gatherings with Friends and Family: Not on file   Attends Religious Services: Not on file   Active Member of Clubs or Organizations: Not on file   Attends Banker Meetings: Not on file   Marital Status: Not on file  Intimate Partner Violence:    Fear of Current or Ex-Partner: Not on file   Emotionally Abused: Not on file   Physically Abused: Not on file   Sexually Abused: Not on file   Family History  Problem Relation Age of Onset   Osteoporosis Mother    High blood pressure Mother    Heart disease Father    Hyperlipidemia Father    Hypertension Father    Diabetes Father    High blood pressure Father    Depression Sister    Cancer Sister        metastatic   Diabetes Sister    Colon cancer Sister     OBJECTIVE:  Vitals:   04/02/20 1832 04/02/20 1833  BP:  (!) 149/74  Pulse:  (!) 104  Resp:  18  Temp:  98.2 F (36.8 C)  TempSrc:  Oral  SpO2:  94%  Weight: 160 lb (72.6 kg)   Height: 5\' 7"  (1.702 m)      Physical Exam Vitals and nursing note reviewed.  Constitutional:      General: She is not in acute distress.    Appearance: Normal appearance. She is normal weight. She is not ill-appearing, toxic-appearing or diaphoretic.  HENT:     Head: Normocephalic. Abrasion present.      Comments: Multiple abrasion site in her face    Right Ear: Tympanic membrane, ear canal and external ear normal. There is no impacted cerumen.     Left Ear: Tympanic  membrane, ear canal and external ear normal. There is no impacted cerumen.     Nose: Signs of injury and nasal tenderness present.     Right Nostril: No foreign body, epistaxis or septal hematoma.     Left Nostril: No foreign body, epistaxis or  septal hematoma.     Right Turbinates: Not enlarged.     Left Turbinates: Not enlarged or swollen.     Right Sinus: No maxillary sinus tenderness.     Left Sinus: No maxillary sinus tenderness.  Cardiovascular:     Rate and Rhythm: Normal rate and regular rhythm.     Pulses: Normal pulses.     Heart sounds: Normal heart sounds. No murmur heard.  No friction rub. No gallop.   Pulmonary:     Effort: Pulmonary effort is normal. No respiratory distress.     Breath sounds: Normal breath sounds. No stridor. No wheezing, rhonchi or rales.  Chest:     Chest wall: No tenderness.  Musculoskeletal:       Arms:       Legs:     Comments: Abrasion to right elbow and left knee  Skin:    Findings: Abrasion present.  Neurological:     General: No focal deficit present.     Mental Status: She is alert and oriented to person, place, and time.     GCS: GCS eye subscore is 4. GCS verbal subscore is 5. GCS motor subscore is 6.     Cranial Nerves: Cranial nerves are intact.     Sensory: Sensation is intact.     Motor: Motor function is intact.     Coordination: Coordination is intact.     Gait: Gait is intact.     Deep Tendon Reflexes:     Reflex Scores:      Patellar reflexes are 2+ on the right side and 2+ on the left side.     LABS:  No results found for this or any previous visit (from the past 24 hour(s)).   RADIOLOGY:  DG Nasal Bones  Result Date: 04/02/2020 CLINICAL DATA:  Fall with nasal swelling. Fall outside on pavement. Swelling and bruising to right eye and nose with bleeding. EXAM: NASAL BONES - 3+ VIEW COMPARISON:  None. FINDINGS: Minimally depressed fracture of the nasal spine. Nasal septum is midline. No additional obvious facial  bone fracture. No maxillary sinus fluid level to suggest orbital fracture. Patient is edentulous. IMPRESSION: Minimally depressed nasal spine fracture. Electronically Signed   By: Narda Rutherford M.D.   On: 04/02/2020 19:24   Nasal bone x-ray is positive for acute fracture of the nasal spine.  I have reviewed the x-ray myself and the radiologist interpretation.  I am in agreement with the radiologist interpretation.   ASSESSMENT & PLAN:  1. Fall, initial encounter   2. Closed fracture of nasal bone, initial encounter     No orders of the defined types were placed in this encounter.   Discharge instructions  Take OTC Tylenol/ibuprofen as needed for pain Follow-up with ENT ENT resource was provided Return or go to ED for worsening of symptoms  Reviewed expectations re: course of current medical issues. Questions answered. Outlined signs and symptoms indicating need for more acute intervention. Patient verbalized understanding. After Visit Summary given.         Durward Parcel, FNP 04/02/20 1941

## 2020-04-02 NOTE — ED Notes (Signed)
Facial wounds, RT elbow wound and abrasion to LT knee cleaned with sure cleanse, abx ointment applied, covered w/ band aid.

## 2020-04-02 NOTE — Discharge Instructions (Addendum)
Take OTC Tylenol/ibuprofen as needed for pain Follow-up with ENT ENT resource was provided Return or go to ED for worsening of symptom

## 2020-04-02 NOTE — ED Triage Notes (Addendum)
Pt fell onto pavement while checking the mail, states she lost her balance.  No LOC.  Pt is awake and alert x 4.  Lacerations to RT side of forehead and top of nose.  Skin tear to RT elbow, abrasion and LT knee.  Denies being on any blood thinners

## 2020-04-03 DIAGNOSIS — S022XXS Fracture of nasal bones, sequela: Secondary | ICD-10-CM | POA: Diagnosis not present

## 2020-04-03 DIAGNOSIS — W010XXD Fall on same level from slipping, tripping and stumbling without subsequent striking against object, subsequent encounter: Secondary | ICD-10-CM | POA: Diagnosis not present

## 2020-04-09 ENCOUNTER — Other Ambulatory Visit: Payer: Self-pay

## 2020-04-09 ENCOUNTER — Ambulatory Visit (INDEPENDENT_AMBULATORY_CARE_PROVIDER_SITE_OTHER): Payer: Medicare Other | Admitting: Otolaryngology

## 2020-04-09 DIAGNOSIS — S022XXA Fracture of nasal bones, initial encounter for closed fracture: Secondary | ICD-10-CM | POA: Diagnosis not present

## 2020-04-09 NOTE — Progress Notes (Signed)
HPI: Natalie Long is a 84 y.o. female who presents is referred by her PCP Dr. Margo Aye for evaluation of nasal fracture.  Patient apparently fell on 04/02/2020 landing on her face.  She was seen at [facility] urgent care and had a plain x-ray of her nose that showed a minimally displaced nasal bone fracture.  She complains of soreness but does not having trouble breathing through her nose.Marland Kitchen  Past Medical History:  Diagnosis Date  . Anxiety   . Arthritis   . Back pain   . Constipation   . Cystocele   . Depression   . Diabetes mellitus without complication (HCC)   . Gastric reflux   . Hyperlipidemia   . Hypertension   . Rectocele   . Thyroid disease    Past Surgical History:  Procedure Laterality Date  . ABDOMINAL HYSTERECTOMY    . CATARACT EXTRACTION  2004 and 2008  . COLONOSCOPY  2004   Dr Rayann Heman  . COLONOSCOPY N/A 08/12/2012   Procedure: COLONOSCOPY;  Surgeon: Corbin Ade, MD;  Location: [facility];  Service: Endoscopy;  Laterality: N/A;  11:30  . ESOPHAGOGASTRODUODENOSCOPY  01/01/2009   [id number] hiatal hernia.Normal esophagus.Antral nodule, status post biopsy   Social History   Socioeconomic History  . Marital status: Widowed    Spouse name: Not on file  . Number of children: Not on file  . Years of education: Not on file  . Highest education level: Not on file  Occupational History  . Not on file  Tobacco Use  . Smoking status: Former Games developer  . Smokeless tobacco: Never Used  Vaping Use  . Vaping Use: Never used  Substance and Sexual Activity  . Alcohol use: No  . Drug use: No  . Sexual activity: Not on file  Other Topics Concern  . Not on file  Social History Narrative  . Not on file   Social Determinants of Health   Financial Resource Strain:   . Difficulty of Paying Living Expenses: Not on file  Food Insecurity:   . Worried About Programme researcher, broadcasting/film/video in the Last Year: Not on file  . Ran Out of Food in the Last Year: Not on file   Transportation Needs:   . Lack of Transportation (Medical): Not on file  . Lack of Transportation (Non-Medical): Not on file  Physical Activity:   . Days of Exercise per Week: Not on file  . Minutes of Exercise per Session: Not on file  Stress:   . Feeling of Stress : Not on file  Social Connections:   . Frequency of Communication with Friends and Family: Not on file  . Frequency of Social Gatherings with Friends and Family: Not on file  . Attends Religious Services: Not on file  . Active Member of Clubs or Organizations: Not on file  . Attends Banker Meetings: Not on file  . Marital Status: Not on file   Family History  Problem Relation Age of Onset  . Osteoporosis Mother   . High blood pressure Mother   . Heart disease Father   . Hyperlipidemia Father   . Hypertension Father   . Diabetes Father   . High blood pressure Father   . Depression Sister   . Cancer Sister        metastatic  . Diabetes Sister   . Colon cancer Sister    Allergies  Allergen Reactions  . Codeine Nausea Only   Prior to Admission medications  Medication Sig Start Date End Date Taking? Authorizing Provider  ALPRAZolam Prudy Feeler) 0.5 MG tablet Take 0.5 mg by mouth at bedtime.  12/24/17   [provider]  amLODipine-olmesartan (AZOR) 5-20 MG tablet Take 1 tablet by mouth daily.  06/18/15   [provider]  Calcium Carbonate-Vitamin D (CALCIUM 600+D) 600-400 MG-UNIT per tablet Take 2 tablets by mouth daily.    [provider]  clobetasol cream (TEMOVATE) 0.05 % APPLY TO AFFECTED AREAS TWICE DAILY. 04/04/19   Lazaro Arms, MD  diclofenac Sodium (VOLTAREN) 1 % GEL Apply small amount to the affected area (left shoulder) as needed for pain 08/27/19   Avegno, Zachery Dakins, FNP  hydrochlorothiazide (HYDRODIURIL) 25 MG tablet Take 1 tablet (25 mg total) by mouth daily. 10/06/12   [name], MD  HYDROcodone-acetaminophen (NORCO/VICODIN) 5-325 MG tablet Take 1-2 tablets by  mouth every 6 (six) hours as needed for moderate pain.  06/23/15   [provider]  levothyroxine (SYNTHROID, LEVOTHROID) 75 MCG tablet Take 75 mcg by mouth daily before breakfast.    [provider]  metFORMIN (GLUCOPHAGE) 500 MG tablet Take 500 mg by mouth 2 (two) times daily with a meal.    [provider]  Multiple Vitamins-Minerals (VISION-VITE PRESERVE PO) Take by mouth.    [provider]  nitrofurantoin, macrocrystal-monohydrate, (MACROBID) 100 MG capsule Take 1 capsule (100 mg total) by mouth 2 (two) times daily. 08/27/19   Avegno, Zachery Dakins, FNP  omeprazole (PRILOSEC) 20 MG capsule Take 20 mg by mouth daily.  06/18/15   [provider]  ONE TOUCH ULTRA TEST test strip  11/03/17   [provider]  predniSONE (DELTASONE) 10 MG tablet Take 10 mg by mouth daily with breakfast.  12/25/17   [provider]  traMADol (ULTRAM) 50 MG tablet Take 50 mg by mouth daily as needed for moderate pain or severe pain.  06/18/15   [provider]     Positive ROS: Otherwise negative  All other systems have been reviewed and were otherwise negative with the exception of those mentioned in the HPI and as above.  Physical Exam: Constitutional: Alert, well-appearing, no acute distress Ears: External ears without lesions or tenderness. Ear canals are clear bilaterally with intact, clear TMs.  Nasal: She has a small contusion on the dorsum of the nose.  But she has minimal nasal dorsal deformity.  She has surrounding periorbital ecchymosis.  Intranasal exam reveals septum to be minimally deviated with no evidence of septal hematoma.  Nasal passages are clear otherwise. Oral: Lips and gums without lesions. Tongue and palate mucosa without lesions. Posterior oropharynx clear. Neck: No palpable adenopathy or masses Respiratory: Breathing comfortably  Skin: No facial/neck lesions or rash noted. I reviewed the x-rays which showed minimal deformity  of nasal dorsal fracture.  Procedures  Assessment: Minimally displaced nasal bone fracture with no significant structural abnormality or nasal deformity.  Plan: Reviewed with the patient that she will have soreness of her nose for 3 to 4 weeks but no intervention is needed.   Narda Bonds, MD   CC:

## 2020-04-10 ENCOUNTER — Other Ambulatory Visit (HOSPITAL_COMMUNITY): Payer: Self-pay | Admitting: Internal Medicine

## 2020-04-10 DIAGNOSIS — Z13820 Encounter for screening for osteoporosis: Secondary | ICD-10-CM

## 2020-04-10 DIAGNOSIS — D519 Vitamin B12 deficiency anemia, unspecified: Secondary | ICD-10-CM | POA: Diagnosis not present

## 2020-04-11 ENCOUNTER — Ambulatory Visit (INDEPENDENT_AMBULATORY_CARE_PROVIDER_SITE_OTHER): Payer: Medicare Other | Admitting: Otolaryngology

## 2020-04-12 ENCOUNTER — Ambulatory Visit (INDEPENDENT_AMBULATORY_CARE_PROVIDER_SITE_OTHER): Payer: Medicare Other | Admitting: Orthopaedic Surgery

## 2020-04-12 ENCOUNTER — Encounter: Payer: Self-pay | Admitting: Orthopaedic Surgery

## 2020-04-12 ENCOUNTER — Other Ambulatory Visit: Payer: Self-pay

## 2020-04-12 VITALS — BP 126/71 | HR 102 | Ht 67.0 in | Wt 160.0 lb

## 2020-04-12 DIAGNOSIS — M25562 Pain in left knee: Secondary | ICD-10-CM

## 2020-04-12 DIAGNOSIS — M25561 Pain in right knee: Secondary | ICD-10-CM

## 2020-04-12 DIAGNOSIS — G8929 Other chronic pain: Secondary | ICD-10-CM

## 2020-04-12 NOTE — Progress Notes (Signed)
PROCEDURE NOTE:  The patient requests injections of the left knee , verbal consent was obtained.  The left knee was prepped appropriately after time out was performed.   Sterile technique was observed and injection of 1 cc of Depo-Medrol 40 mg with several cc's of plain xylocaine. Anesthesia was provided by ethyl chloride and a 20-gauge needle was used to inject the knee area. The injection was tolerated well.  A band aid dressing was applied.  The patient was advised to apply ice later today and tomorrow to the injection sight as needed.  PROCEDURE NOTE:  The patient requests injections of the right knee , verbal consent was obtained.  The right knee was prepped appropriately after time out was performed.   Sterile technique was observed and injection of 1 cc of Depo-Medrol 40 mg with several cc's of plain xylocaine. Anesthesia was provided by ethyl chloride and a 20-gauge needle was used to inject the knee area. The injection was tolerated well.  A band aid dressing was applied.  The patient was advised to apply ice later today and tomorrow to the injection sight as needed.  Return in one month.  She fell about 11 days ago and hit her face and right elbow.  She is slowly improving but has large ecchymosis of face and eyes.  Call if any problem.  Precautions discussed.   Electronically Signed Darreld Mclean, MD 11/11/20219:06 AM

## 2020-04-20 DIAGNOSIS — M2041 Other hammer toe(s) (acquired), right foot: Secondary | ICD-10-CM | POA: Diagnosis not present

## 2020-04-20 DIAGNOSIS — L84 Corns and callosities: Secondary | ICD-10-CM | POA: Diagnosis not present

## 2020-04-20 DIAGNOSIS — B351 Tinea unguium: Secondary | ICD-10-CM | POA: Diagnosis not present

## 2020-04-20 DIAGNOSIS — M79674 Pain in right toe(s): Secondary | ICD-10-CM | POA: Diagnosis not present

## 2020-04-20 DIAGNOSIS — E119 Type 2 diabetes mellitus without complications: Secondary | ICD-10-CM | POA: Diagnosis not present

## 2020-05-09 DIAGNOSIS — D519 Vitamin B12 deficiency anemia, unspecified: Secondary | ICD-10-CM | POA: Diagnosis not present

## 2020-05-10 ENCOUNTER — Ambulatory Visit (INDEPENDENT_AMBULATORY_CARE_PROVIDER_SITE_OTHER): Payer: Medicare Other | Admitting: Orthopaedic Surgery

## 2020-05-10 ENCOUNTER — Other Ambulatory Visit: Payer: Self-pay

## 2020-05-10 ENCOUNTER — Encounter: Payer: Self-pay | Admitting: Orthopaedic Surgery

## 2020-05-10 VITALS — BP 129/81 | HR 96 | Ht 67.0 in | Wt 160.0 lb

## 2020-05-10 DIAGNOSIS — M25561 Pain in right knee: Secondary | ICD-10-CM | POA: Diagnosis not present

## 2020-05-10 DIAGNOSIS — M25562 Pain in left knee: Secondary | ICD-10-CM

## 2020-05-10 DIAGNOSIS — G8929 Other chronic pain: Secondary | ICD-10-CM

## 2020-05-10 NOTE — Progress Notes (Signed)
PROCEDURE NOTE:  The patient requests injections of the right knee , verbal consent was obtained.  The right knee was prepped appropriately after time out was performed.   Sterile technique was observed and injection of 1 cc of Depo-Medrol 40 mg with several cc's of plain xylocaine. Anesthesia was provided by ethyl chloride and a 20-gauge needle was used to inject the knee area. The injection was tolerated well.  A band aid dressing was applied.  The patient was advised to apply ice later today and tomorrow to the injection sight as needed.  PROCEDURE NOTE:  The patient requests injections of the left knee , verbal consent was obtained.  The left knee was prepped appropriately after time out was performed.   Sterile technique was observed and injection of 1 cc of Depo-Medrol 40 mg with several cc's of plain xylocaine. Anesthesia was provided by ethyl chloride and a 20-gauge needle was used to inject the knee area. The injection was tolerated well.  A band aid dressing was applied.  The patient was advised to apply ice later today and tomorrow to the injection sight as needed.  Return in one month.  Electronically Signed Darreld Mclean, MD 12/9/20219:24 AM

## 2020-06-05 ENCOUNTER — Other Ambulatory Visit (HOSPITAL_COMMUNITY): Payer: Self-pay | Admitting: Internal Medicine

## 2020-06-05 DIAGNOSIS — Z1231 Encounter for screening mammogram for malignant neoplasm of breast: Secondary | ICD-10-CM

## 2020-06-08 DIAGNOSIS — D519 Vitamin B12 deficiency anemia, unspecified: Secondary | ICD-10-CM | POA: Diagnosis not present

## 2020-06-12 ENCOUNTER — Ambulatory Visit (INDEPENDENT_AMBULATORY_CARE_PROVIDER_SITE_OTHER): Payer: Medicare Other | Admitting: Orthopaedic Surgery

## 2020-06-12 ENCOUNTER — Other Ambulatory Visit: Payer: Self-pay

## 2020-06-12 ENCOUNTER — Encounter: Payer: Self-pay | Admitting: Orthopaedic Surgery

## 2020-06-12 DIAGNOSIS — G8929 Other chronic pain: Secondary | ICD-10-CM | POA: Diagnosis not present

## 2020-06-12 DIAGNOSIS — M25561 Pain in right knee: Secondary | ICD-10-CM | POA: Diagnosis not present

## 2020-06-12 DIAGNOSIS — M25562 Pain in left knee: Secondary | ICD-10-CM

## 2020-06-12 NOTE — Progress Notes (Signed)
PROCEDURE NOTE:  The patient requests injections of the right knee , verbal consent was obtained.  The right knee was prepped appropriately after time out was performed.   Sterile technique was observed and injection of 1 cc of Depo-Medrol 40 mg with several cc's of plain xylocaine. Anesthesia was provided by ethyl chloride and a 20-gauge needle was used to inject the knee area. The injection was tolerated well.  A band aid dressing was applied.  The patient was advised to apply ice later today and tomorrow to the injection sight as needed.  PROCEDURE NOTE:  The patient requests injections of the left knee , verbal consent was obtained.  The left knee was prepped appropriately after time out was performed.   Sterile technique was observed and injection of 1 cc of Depo-Medrol 40 mg with several cc's of plain xylocaine. Anesthesia was provided by ethyl chloride and a 20-gauge needle was used to inject the knee area. The injection was tolerated well.  A band aid dressing was applied.  The patient was advised to apply ice later today and tomorrow to the injection sight as needed.  Return in one month.  Electronically Signed Darreld Mclean, MD 1/11/20221:56 PM

## 2020-06-14 ENCOUNTER — Other Ambulatory Visit: Payer: Self-pay

## 2020-06-14 ENCOUNTER — Ambulatory Visit (HOSPITAL_COMMUNITY)
Admission: RE | Admit: 2020-06-14 | Discharge: 2020-06-14 | Disposition: A | Discharge status: Home / Self Care | Payer: Medicare Other | Source: Ambulatory Visit | Attending: Internal Medicine | Admitting: Internal Medicine

## 2020-06-14 DIAGNOSIS — R29890 Loss of height: Secondary | ICD-10-CM | POA: Diagnosis not present

## 2020-06-14 DIAGNOSIS — Z13820 Encounter for screening for osteoporosis: Secondary | ICD-10-CM | POA: Insufficient documentation

## 2020-06-14 DIAGNOSIS — M810 Age-related osteoporosis without current pathological fracture: Secondary | ICD-10-CM | POA: Diagnosis not present

## 2020-06-14 DIAGNOSIS — Z8739 Personal history of other diseases of the musculoskeletal system and connective tissue: Secondary | ICD-10-CM | POA: Diagnosis not present

## 2020-06-14 DIAGNOSIS — Z780 Asymptomatic menopausal state: Secondary | ICD-10-CM | POA: Insufficient documentation

## 2020-06-18 DIAGNOSIS — M810 Age-related osteoporosis without current pathological fracture: Secondary | ICD-10-CM | POA: Diagnosis not present

## 2020-07-09 ENCOUNTER — Ambulatory Visit (HOSPITAL_COMMUNITY): Payer: Medicare Other

## 2020-07-10 ENCOUNTER — Ambulatory Visit (INDEPENDENT_AMBULATORY_CARE_PROVIDER_SITE_OTHER): Payer: Medicare Other | Admitting: Orthopaedic Surgery

## 2020-07-10 ENCOUNTER — Encounter: Payer: Self-pay | Admitting: Orthopaedic Surgery

## 2020-07-10 ENCOUNTER — Other Ambulatory Visit: Payer: Self-pay

## 2020-07-10 DIAGNOSIS — M25562 Pain in left knee: Secondary | ICD-10-CM

## 2020-07-10 DIAGNOSIS — G8929 Other chronic pain: Secondary | ICD-10-CM

## 2020-07-10 DIAGNOSIS — D519 Vitamin B12 deficiency anemia, unspecified: Secondary | ICD-10-CM | POA: Diagnosis not present

## 2020-07-10 DIAGNOSIS — M25561 Pain in right knee: Secondary | ICD-10-CM | POA: Diagnosis not present

## 2020-07-10 NOTE — Progress Notes (Signed)
PROCEDURE NOTE:  The patient requests injections of the right knee , verbal consent was obtained.  The right knee was prepped appropriately after time out was performed.   Sterile technique was observed and injection of 1 cc of Celestone 6 mg with several cc's of plain xylocaine. Anesthesia was provided by ethyl chloride and a 20-gauge needle was used to inject the knee area. The injection was tolerated well.  A band aid dressing was applied.  The patient was advised to apply ice later today and tomorrow to the injection sight as needed.  PROCEDURE NOTE:  The patient requests injections of the left knee , verbal consent was obtained.  The left knee was prepped appropriately after time out was performed.   Sterile technique was observed and injection of 1 cc of Celestone 6 mg with several cc's of plain xylocaine. Anesthesia was provided by ethyl chloride and a 20-gauge needle was used to inject the knee area. The injection was tolerated well.  A band aid dressing was applied.  The patient was advised to apply ice later today and tomorrow to the injection sight as needed.  Return in one month.  Electronically Signed Darreld Mclean, MD 2/8/20222:18 PM

## 2020-07-23 ENCOUNTER — Ambulatory Visit (HOSPITAL_COMMUNITY): Payer: Medicare Other

## 2020-07-25 ENCOUNTER — Other Ambulatory Visit: Payer: Self-pay

## 2020-07-25 ENCOUNTER — Ambulatory Visit (HOSPITAL_COMMUNITY)
Admission: RE | Admit: 2020-07-25 | Discharge: 2020-07-25 | Disposition: A | Discharge status: Home / Self Care | Payer: Medicare Other | Source: Ambulatory Visit | Attending: Internal Medicine | Admitting: Internal Medicine

## 2020-07-25 DIAGNOSIS — Z1231 Encounter for screening mammogram for malignant neoplasm of breast: Secondary | ICD-10-CM | POA: Insufficient documentation

## 2020-08-07 ENCOUNTER — Encounter: Payer: Self-pay | Admitting: Orthopaedic Surgery

## 2020-08-07 ENCOUNTER — Other Ambulatory Visit: Payer: Self-pay

## 2020-08-07 ENCOUNTER — Ambulatory Visit: Payer: Medicare Other

## 2020-08-07 ENCOUNTER — Ambulatory Visit (INDEPENDENT_AMBULATORY_CARE_PROVIDER_SITE_OTHER): Payer: Medicare Other | Admitting: Orthopaedic Surgery

## 2020-08-07 VITALS — Ht 67.0 in | Wt 160.0 lb

## 2020-08-07 DIAGNOSIS — M25562 Pain in left knee: Secondary | ICD-10-CM | POA: Diagnosis not present

## 2020-08-07 DIAGNOSIS — G8929 Other chronic pain: Secondary | ICD-10-CM | POA: Diagnosis not present

## 2020-08-07 DIAGNOSIS — D519 Vitamin B12 deficiency anemia, unspecified: Secondary | ICD-10-CM | POA: Diagnosis not present

## 2020-08-07 DIAGNOSIS — M25512 Pain in left shoulder: Secondary | ICD-10-CM | POA: Diagnosis not present

## 2020-08-07 DIAGNOSIS — M25561 Pain in right knee: Secondary | ICD-10-CM | POA: Diagnosis not present

## 2020-08-07 NOTE — Progress Notes (Signed)
Patient Natalie Long, female DOB:24-Apr-1936, 85 y.o. SWN:[id number]  Chief Complaint  Patient presents with  . Injections    Bilateral knees, also wants to know if she can have left shoulder injected painful and decrease ROM since Mammogram     HPI  Natalie Long is a 85 y.o. female who has bilateral knee pain, worse on the right. She has also begun to have left shoulder pain over the last month.  She says the shoulder pain began after she had a mammogram and had to hang over the machine.  She has pain with overhead use and sleeping.  Rest, ice, heat and Tylenol have not helped.   Body mass index is 25.06 kg/m.  ROS  Review of Systems  Musculoskeletal: Positive for arthralgias, back pain, gait problem and joint swelling.  Psychiatric/Behavioral: The patient is nervous/anxious.     All other systems reviewed and are negative.  The following is a summary of the past history medically, past history surgically, known current medicines, social history and family history.  This information is gathered electronically by the computer from prior information and documentation.  I review this each visit and have found including this information at this point in the chart is beneficial and informative.    Past Medical History:  Diagnosis Date  . Anxiety   . Arthritis   . Back pain   . Constipation   . Cystocele   . Depression   . Diabetes mellitus without complication (HCC)   . Gastric reflux   . Hyperlipidemia   . Hypertension   . Rectocele   . Thyroid disease     Past Surgical History:  Procedure Laterality Date  . ABDOMINAL HYSTERECTOMY    . CATARACT EXTRACTION  2004 and 2008  . COLONOSCOPY  2004   Dr Rayann Heman  . COLONOSCOPY N/A 08/12/2012   Procedure: COLONOSCOPY;  Surgeon: Corbin Ade, MD;  Location: [facility];  Service: Endoscopy;  Laterality: N/A;  11:30  . ESOPHAGOGASTRODUODENOSCOPY  01/01/2009   [id number] hiatal hernia.Normal  esophagus.Antral nodule, status post biopsy    Family History  Problem Relation Age of Onset  . Osteoporosis Mother   . High blood pressure Mother   . Heart disease Father   . Hyperlipidemia Father   . Hypertension Father   . Diabetes Father   . High blood pressure Father   . Depression Sister   . Cancer Sister        metastatic  . Diabetes Sister   . Colon cancer Sister     Social History Social History   Tobacco Use  . Smoking status: Former Games developer  . Smokeless tobacco: Never Used  Vaping Use  . Vaping Use: Never used  Substance Use Topics  . Alcohol use: No  . Drug use: No    Allergies  Allergen Reactions  . Codeine Nausea Only    Current Outpatient Medications  Medication Sig Dispense Refill  . ALPRAZolam (XANAX) 0.5 MG tablet Take 0.5 mg by mouth at bedtime.     Marland Kitchen amLODipine-olmesartan (AZOR) 5-20 MG tablet Take 1 tablet by mouth daily.     . Calcium Carbonate-Vitamin D 600-400 MG-UNIT tablet Take 2 tablets by mouth daily.    . clobetasol cream (TEMOVATE) 0.05 % APPLY TO AFFECTED AREAS TWICE DAILY. 30 g 6  . cyanocobalamin 1000 MCG tablet Take 1,000 mcg by mouth daily.    . diclofenac Sodium (VOLTAREN) 1 % GEL Apply small amount to the affected area (left shoulder)  as needed for pain 4 g 0  . hydrochlorothiazide (HYDRODIURIL) 25 MG tablet Take 1 tablet (25 mg total) by mouth daily. 90 tablet 1  . HYDROcodone-acetaminophen (NORCO/VICODIN) 5-325 MG tablet Take 1-2 tablets by mouth every 6 (six) hours as needed for moderate pain.    Marland Kitchen levothyroxine (SYNTHROID, LEVOTHROID) 75 MCG tablet Take 75 mcg by mouth daily before breakfast.    . metFORMIN (GLUCOPHAGE) 500 MG tablet Take 500 mg by mouth 2 (two) times daily with a meal.    . Multiple Vitamins-Minerals (VISION-VITE PRESERVE PO) Take by mouth.    . nitrofurantoin, macrocrystal-monohydrate, (MACROBID) 100 MG capsule Take 1 capsule (100 mg total) by mouth 2 (two) times daily. 10 capsule 0  . omeprazole (PRILOSEC)  20 MG capsule Take 20 mg by mouth daily.     . ONE TOUCH ULTRA TEST test strip     . predniSONE (DELTASONE) 10 MG tablet Take 10 mg by mouth daily with breakfast.    . traMADol (ULTRAM) 50 MG tablet Take 50 mg by mouth daily as needed for moderate pain or severe pain.     . Vitamin D, Ergocalciferol, (DRISDOL) 1.25 MG (50000 UNIT) CAPS capsule Take 50,000 Units by mouth every 7 (seven) days.     No current facility-administered medications for this visit.     Physical Exam  Height 5\' 7"  (1.702 m), weight 160 lb (72.6 kg).  Constitutional: overall normal hygiene, normal nutrition, well developed, normal grooming, normal body habitus. Assistive device:none  Musculoskeletal: gait and station Limp right, muscle tone and strength are normal, no tremors or atrophy is present.  .  Neurological: coordination overall normal.  Deep tendon reflex/nerve stretch intact.  Sensation normal.  Cranial nerves II-XII intact.   Skin:   Normal overall no scars, lesions, ulcers or rashes. No psoriasis.  Psychiatric: Alert and oriented x 3.  Recent memory intact, remote memory unclear.  Normal mood and affect. Well groomed.  Good eye contact.  Cardiovascular: overall no swelling, no varicosities, no edema bilaterally, normal temperatures of the legs and arms, no clubbing, cyanosis and good capillary refill.  Lymphatic: palpation is normal.  Examination of left Upper Extremity is done.  Inspection:   Overall:  Elbow non-tender without crepitus or defects, forearm non-tender without crepitus or defects, wrist non-tender without crepitus or defects, hand non-tender.    Shoulder: with glenohumeral joint tenderness, without effusion.   Upper arm:  without swelling and tenderness   Range of motion:   Overall:  Full range of motion of the elbow, full range of motion of wrist and full range of motion in fingers.   Shoulder:  left  145 degrees forward flexion; 120 degrees abduction; 30 degrees internal  rotation, 30 degrees external rotation, 10 degrees extension, 40 degrees adduction.   Stability:   Overall:  Shoulder, elbow and wrist stable   Strength and Tone:   Overall full shoulder muscles strength, full upper arm strength and normal upper arm bulk and tone.   Both knees have effusions, crepitus, ROM right 0 to 105, left 0 to 110, no distal edema, NV intact.  All other systems reviewed and are negative   The patient has been educated about the nature of the problem(s) and counseled on treatment options.  The patient appeared to understand what I have discussed and is in agreement with it.  X-rays were done of the left shoulder, reported separately.  Encounter Diagnoses  Name Primary?  . Chronic right shoulder pain Yes  . Chronic  pain of both knees    PROCEDURE NOTE:  The patient requests injections of the right knee , verbal consent was obtained.  The right knee was prepped appropriately after time out was performed.   Sterile technique was observed and injection of 1 cc of Celestone 6 mg with several cc's of plain xylocaine. Anesthesia was provided by ethyl chloride and a 20-gauge needle was used to inject the knee area. The injection was tolerated well.  A band aid dressing was applied.  The patient was advised to apply ice later today and tomorrow to the injection sight as needed.  PROCEDURE NOTE:  The patient request injection, verbal consent was obtained.  The left shoulder was prepped appropriately after time out was performed.   Sterile technique was observed and injection of 1 cc of Celestone 6 mg with several cc's of plain xylocaine. Anesthesia was provided by ethyl chloride and a 20-gauge needle was used to inject the shoulder area. A posterior approach was used.  The injection was tolerated well.  A band aid dressing was applied.  The patient was advised to apply ice later today and tomorrow to the injection sight as needed.   PLAN Call if any problems.   Precautions discussed.  Continue current medications.   Return to clinic 3 weeks   Electronically Signed Darreld Mclean, MD 3/8/20222:56 PM

## 2020-08-28 ENCOUNTER — Other Ambulatory Visit: Payer: Self-pay

## 2020-08-28 ENCOUNTER — Encounter: Payer: Self-pay | Admitting: Orthopaedic Surgery

## 2020-08-28 ENCOUNTER — Ambulatory Visit (INDEPENDENT_AMBULATORY_CARE_PROVIDER_SITE_OTHER): Payer: Medicare Other | Admitting: Orthopaedic Surgery

## 2020-08-28 DIAGNOSIS — M25512 Pain in left shoulder: Secondary | ICD-10-CM | POA: Diagnosis not present

## 2020-08-28 DIAGNOSIS — G8929 Other chronic pain: Secondary | ICD-10-CM

## 2020-08-28 DIAGNOSIS — M25561 Pain in right knee: Secondary | ICD-10-CM | POA: Diagnosis not present

## 2020-08-28 NOTE — Progress Notes (Signed)
PROCEDURE NOTE:  The patient requests injections of the right knee , verbal consent was obtained.  The right knee was prepped appropriately after time out was performed.   Sterile technique was observed and injection of 1 cc of Celestone 6 mg with several cc's of plain xylocaine. Anesthesia was provided by ethyl chloride and a 20-gauge needle was used to inject the knee area. The injection was tolerated well.  A band aid dressing was applied.  The patient was advised to apply ice later today and tomorrow to the injection sight as needed.  PROCEDURE NOTE:  The patient request injection, verbal consent was obtained.  The left shoulder was prepped appropriately after time out was performed.   Sterile technique was observed and injection of 1 cc of Celestone 6 mg with several cc's of plain xylocaine. Anesthesia was provided by ethyl chloride and a 20-gauge needle was used to inject the shoulder area. A posterior approach was used.  The injection was tolerated well.  A band aid dressing was applied.  The patient was advised to apply ice later today and tomorrow to the injection sight as needed.   Return in one month.  Electronically Signed Darreld Mclean, MD 3/29/20222:49 PM

## 2020-09-05 DIAGNOSIS — D519 Vitamin B12 deficiency anemia, unspecified: Secondary | ICD-10-CM | POA: Diagnosis not present

## 2020-09-19 DIAGNOSIS — M79675 Pain in left toe(s): Secondary | ICD-10-CM | POA: Diagnosis not present

## 2020-09-19 DIAGNOSIS — M79672 Pain in left foot: Secondary | ICD-10-CM | POA: Diagnosis not present

## 2020-09-19 DIAGNOSIS — S93331D Other subluxation of right foot, subsequent encounter: Secondary | ICD-10-CM | POA: Diagnosis not present

## 2020-09-19 DIAGNOSIS — M79674 Pain in right toe(s): Secondary | ICD-10-CM | POA: Diagnosis not present

## 2020-09-19 DIAGNOSIS — M79671 Pain in right foot: Secondary | ICD-10-CM | POA: Diagnosis not present

## 2020-09-19 DIAGNOSIS — S93332D Other subluxation of left foot, subsequent encounter: Secondary | ICD-10-CM | POA: Diagnosis not present

## 2020-09-19 DIAGNOSIS — I739 Peripheral vascular disease, unspecified: Secondary | ICD-10-CM | POA: Diagnosis not present

## 2020-09-19 DIAGNOSIS — L110 Acquired keratosis follicularis: Secondary | ICD-10-CM | POA: Diagnosis not present

## 2020-09-21 ENCOUNTER — Ambulatory Visit (INDEPENDENT_AMBULATORY_CARE_PROVIDER_SITE_OTHER): Payer: Medicare Other

## 2020-09-21 ENCOUNTER — Ambulatory Visit
Admission: EM | Admit: 2020-09-21 | Discharge: 2020-09-21 | Disposition: A | Discharge status: Home / Self Care | Payer: Medicare Other | Attending: Internal Medicine | Admitting: Internal Medicine

## 2020-09-21 ENCOUNTER — Encounter: Payer: Self-pay | Admitting: Emergency Medicine

## 2020-09-21 DIAGNOSIS — J9811 Atelectasis: Secondary | ICD-10-CM | POA: Diagnosis not present

## 2020-09-21 DIAGNOSIS — R059 Cough, unspecified: Secondary | ICD-10-CM

## 2020-09-21 DIAGNOSIS — K449 Diaphragmatic hernia without obstruction or gangrene: Secondary | ICD-10-CM | POA: Diagnosis not present

## 2020-09-21 DIAGNOSIS — J209 Acute bronchitis, unspecified: Secondary | ICD-10-CM

## 2020-09-21 DIAGNOSIS — I10 Essential (primary) hypertension: Secondary | ICD-10-CM | POA: Diagnosis not present

## 2020-09-21 DIAGNOSIS — R0602 Shortness of breath: Secondary | ICD-10-CM

## 2020-09-21 DIAGNOSIS — D509 Iron deficiency anemia, unspecified: Secondary | ICD-10-CM | POA: Diagnosis not present

## 2020-09-21 DIAGNOSIS — K219 Gastro-esophageal reflux disease without esophagitis: Secondary | ICD-10-CM | POA: Diagnosis not present

## 2020-09-21 DIAGNOSIS — R062 Wheezing: Secondary | ICD-10-CM

## 2020-09-21 DIAGNOSIS — M816 Localized osteoporosis [Lequesne]: Secondary | ICD-10-CM | POA: Diagnosis not present

## 2020-09-21 DIAGNOSIS — F5101 Primary insomnia: Secondary | ICD-10-CM | POA: Diagnosis not present

## 2020-09-21 DIAGNOSIS — E119 Type 2 diabetes mellitus without complications: Secondary | ICD-10-CM | POA: Diagnosis not present

## 2020-09-21 MED ORDER — AZITHROMYCIN 250 MG PO TABS
ORAL_TABLET | ORAL | 0 refills | Status: DC
Start: 2020-09-21 — End: 2020-12-18

## 2020-09-21 MED ORDER — FEXOFENADINE HCL 180 MG PO TABS: 180.0000 mg | ORAL_TABLET | Freq: Every day | ORAL | 0 refills | Status: DC

## 2020-09-21 MED ORDER — ALBUTEROL SULFATE HFA 108 (90 BASE) MCG/ACT IN AERS
2.0000 | INHALATION_SPRAY | Freq: Once | RESPIRATORY_TRACT | Status: AC
  Administered 2020-09-21: 2 via RESPIRATORY_TRACT

## 2020-09-21 MED ORDER — FEXOFENADINE-PSEUDOEPHED ER 60-120 MG PO TB12: 1.0000 | ORAL_TABLET | Freq: Two times a day (BID) | ORAL | 0 refills | Status: DC

## 2020-09-21 MED ORDER — DEXAMETHASONE SODIUM PHOSPHATE 10 MG/ML IJ SOLN: 10.0000 mg | Freq: Once | INTRAMUSCULAR | Status: AC

## 2020-09-21 MED ORDER — DEXAMETHASONE SODIUM PHOSPHATE 10 MG/ML IJ SOLN
10.0000 mg | Freq: Once | INTRAMUSCULAR | Status: AC
  Administered 2020-09-21: 10 mg via INTRAMUSCULAR

## 2020-09-21 NOTE — ED Provider Notes (Signed)
[facility]    CSN: [id number] Arrival date & time: 09/21/20  1412      History   Chief Complaint No chief complaint on file.   HPI Natalie Long is a 85 y.o. female who presents with a non productive cough x 2 days. Started with a lot of sneezing and rhinitis, watery and itchy eyes. She feels bad, but states she denies HA, body aches, sweats, N/V/D     Past Medical History:  Diagnosis Date  . Anxiety   . Arthritis   . Back pain   . Constipation   . Cystocele   . Depression   . Diabetes mellitus without complication (HCC)   . Gastric reflux   . Hyperlipidemia   . Hypertension   . Rectocele   . Thyroid disease     Patient Active Problem List   Diagnosis Date Noted  . Hyperkeratosis 06/23/2013  . Metatarsalgia of left foot 03/23/2013  . PN (peripheral neuropathy) 03/23/2013  . Chronic pain 02/08/2013  . Constipation, chronic 08/09/2012  . Diabetes with proteinuria 07/24/2012  . Osteoporosis, unspecified 07/13/2012  . Callus of foot 05/12/2012  . Hypothyroidism 03/12/2012  . Osteopenia 09/10/2011  . GASTROPARESIS 02/14/2009  . INSOMNIA 07/18/2008  . DIABETES MELLITUS, TYPE II, CONTROLLED, W/NEURO COMPS 03/30/2008  . ANXIETY 06/06/2006  . DEPRESSION 06/06/2006  . MACULAR DEGENERATION 06/06/2006  . HYPERTENSION 06/06/2006  . GERD 06/06/2006  . CONSTIPATION NOS 06/06/2006  . IBS 06/06/2006  . ARTHRITIS 06/06/2006  . LOW BACK PAIN 06/06/2006    Past Surgical History:  Procedure Laterality Date  . ABDOMINAL HYSTERECTOMY    . CATARACT EXTRACTION  2004 and 2008  . COLONOSCOPY  2004   Dr Rayann Heman  . COLONOSCOPY N/A 08/12/2012   Procedure: COLONOSCOPY;  Surgeon: Corbin Ade, MD;  Location: [facility];  Service: Endoscopy;  Laterality: N/A;  11:30  . ESOPHAGOGASTRODUODENOSCOPY  01/01/2009   [id number] hiatal hernia.Normal esophagus.Antral nodule, status post biopsy    OB History    Gravida  1   Para  1   Term  1    Preterm      AB      Living  1     SAB      IAB      Ectopic      Multiple      Live Births               Home Medications    Prior to Admission medications   Medication Sig Start Date End Date Taking? Authorizing Provider  azithromycin (ZITHROMAX) 250 MG tablet Take first 2 tablets together, then 1 every day until finished. 09/21/20  Yes Rodriguez-Southworth, Nettie Elm, PA-C  fexofenadine (ALLEGRA ALLERGY) 180 MG tablet Take 1 tablet (180 mg total) by mouth daily. For allergies 09/21/20  Yes Rodriguez-Southworth, Nettie Elm, PA-C  fexofenadine-pseudoephedrine (ALLEGRA-D) 60-120 MG 12 hr tablet Take 1 tablet by mouth every 12 (twelve) hours. 09/21/20  Yes Rodriguez-Southworth, Nettie Elm, PA-C  ALPRAZolam Prudy Feeler) 0.5 MG tablet Take 0.5 mg by mouth at bedtime.  12/24/17   [provider]  amLODipine-olmesartan (AZOR) 5-20 MG tablet Take 1 tablet by mouth daily.  06/18/15   [provider]  Calcium Carbonate-Vitamin D 600-400 MG-UNIT tablet Take 2 tablets by mouth daily.    [provider]  clobetasol cream (TEMOVATE) 0.05 % APPLY TO AFFECTED AREAS TWICE DAILY. 04/04/19   Lazaro Arms, MD  cyanocobalamin 1000 MCG tablet Take 1,000 mcg by mouth daily.  [provider]  diclofenac Sodium (VOLTAREN) 1 % GEL Apply small amount to the affected area (left shoulder) as needed for pain 08/27/19   Avegno, Zachery Dakins, FNP  hydrochlorothiazide (HYDRODIURIL) 25 MG tablet Take 1 tablet (25 mg total) by mouth daily. 10/06/12   [name], MD  HYDROcodone-acetaminophen (NORCO/VICODIN) 5-325 MG tablet Take 1-2 tablets by mouth every 6 (six) hours as needed for moderate pain. 06/23/15   [provider]  levothyroxine (SYNTHROID, LEVOTHROID) 75 MCG tablet Take 75 mcg by mouth daily before breakfast.    [provider]  metFORMIN (GLUCOPHAGE) 500 MG tablet Take 500 mg by mouth 2 (two) times daily with a meal.    [provider]  Multiple  Vitamins-Minerals (VISION-VITE PRESERVE PO) Take by mouth.    [provider]  nitrofurantoin, macrocrystal-monohydrate, (MACROBID) 100 MG capsule Take 1 capsule (100 mg total) by mouth 2 (two) times daily. 08/27/19   Avegno, Zachery Dakins, FNP  omeprazole (PRILOSEC) 20 MG capsule Take 20 mg by mouth daily.  06/18/15   [provider]  ONE TOUCH ULTRA TEST test strip  11/03/17   [provider]  pantoprazole (PROTONIX) 40 MG tablet Take 40 mg by mouth daily. 08/21/20   [provider]  traMADol (ULTRAM) 50 MG tablet Take 50 mg by mouth daily as needed for moderate pain or severe pain.  06/18/15   [provider]  Vitamin D, Ergocalciferol, (DRISDOL) 1.25 MG (50000 UNIT) CAPS capsule Take 50,000 Units by mouth every 7 (seven) days.    [provider]    Family History Family History  Problem Relation Age of Onset  . Osteoporosis Mother   . High blood pressure Mother   . Heart disease Father   . Hyperlipidemia Father   . Hypertension Father   . Diabetes Father   . High blood pressure Father   . Depression Sister   . Cancer Sister        metastatic  . Diabetes Sister   . Colon cancer Sister     Social History Social History   Tobacco Use  . Smoking status: Former Games developer  . Smokeless tobacco: Never Used  Vaping Use  . Vaping Use: Never used  Substance Use Topics  . Alcohol use: No  . Drug use: No     Allergies   Codeine   Review of Systems Review of Systems  Constitutional: Positive for fatigue. Negative for activity change, appetite change, chills, diaphoresis and fever.  HENT: Positive for congestion, postnasal drip and rhinorrhea. Negative for ear discharge, ear pain, sore throat and voice change.   Eyes: Positive for itching. Negative for discharge.  Respiratory: Positive for cough and wheezing. Negative for chest tightness and shortness of breath.   Cardiovascular: Negative for chest pain.  Gastrointestinal: Negative for  diarrhea, nausea and vomiting.  Endocrine: Positive for polydipsia.       Her glucose was 117 yesterday and 118 today  Genitourinary: Positive for difficulty urinating. Negative for dysuria, frequency and urgency.       Has nocturia and voids very little during the day  Musculoskeletal: Negative for gait problem and myalgias.  Allergic/Immunologic: Positive for environmental allergies.  Neurological: Negative for weakness and headaches.  Hematological: Negative for adenopathy.     Physical Exam Triage Vital Signs ED Triage Vitals [09/21/20 1421]  Enc Vitals Group     BP (!) 100/58     Pulse Rate (!) 114     Resp 18  Temp 99 F (37.2 C)     Temp Source Oral     SpO2 92 %     Weight      Height      Head Circumference      Peak Flow      Pain Score 7     Pain Loc      Pain Edu?      Excl. in GC?    No data found.  Updated Vital Signs BP (!) 100/58 (BP Location: Right Arm)   Pulse (!) 114   Temp 99 F (37.2 C) (Oral)   Resp 18   SpO2 92%  Pulse ox was repeated after taking her mask off and coughed a few times and went up to 94%, after a couple of Albuterol puffs went back to 96-97% Visual Acuity Right Eye Distance:   Left Eye Distance:   Bilateral Distance:    Right Eye Near:   Left Eye Near:    Bilateral Near:     Physical Exam Constitutional:      General: He is not in acute distress.    Appearance: He is not toxic-appearing.  HENT:     Head: Normocephalic.     Right Ear: Tympanic membrane, ear canal and external ear normal.     Left Ear: Ear canal and external ear normal.     Nose: Nose normal.     Mouth/Throat:     Mouth: Mucous membranes are moist.     Pharynx: Oropharynx is clear.  Eyes:     General: No scleral icterus.    Conjunctiva/sclera: Conjunctivae normal.  Cardiovascular:     Rate and Rhythm: Normal rate and regular rhythm.     Heart sounds: No murmur heard.   Pulmonary:     Effort: Pulmonary effort is normal. No respiratory  distress. Not using accesory muscles     Breath sounds: Wheezing present all over which improved a little after the decadron and Albuterol inhaler. Her pulse ox went back to 96-97%    Comments: Has auditory wheezing Musculoskeletal:        General: Normal range of motion.     Cervical back: Neck supple.  Lymphadenopathy:     Cervical: No cervical adenopathy.  Skin:    General: Skin is warm and dry.     Findings: No rash.  Neurological:     Mental Status: He is alert and oriented to person, place, and time.     Gait: Gait normal.  Psychiatric:        Mood and Affect: Mood normal.        Behavior: Behavior normal.        Thought Content: Thought content normal.        Judgment: Judgment normal.    UC Treatments / Results  Labs (all labs ordered are listed, but only abnormal results are displayed) Labs Reviewed - No data to display  EKG   Radiology DG Chest 2 View  Result Date: 09/21/2020 CLINICAL DATA:  Wheezing, cough with shortness of breath x3 days. No fever EXAM: CHEST - 2 VIEW COMPARISON:  August 26, 2019 chest radiograph and CT. FINDINGS: Normal size heart. Large hiatal hernia. Aortic atherosclerosis. Left lower lobe atelectasis. No lobar consolidation. No pleural effusion. No pneumothorax. Thoracic spondylosis. Degenerative changes bilateral shoulders. IMPRESSION: No active cardiopulmonary disease. Electronically Signed   By: Maudry MayhewJeffrey  Waltz MD   On: 09/21/2020 15:23    Procedures Procedures (including critical care time)  Medications Ordered in  UC Medications  dexamethasone (DECADRON) injection 10 mg (has no administration in time range)  albuterol (VENTOLIN HFA) 108 (90 Base) MCG/ACT inhaler 2 puff (2 puffs Inhalation Given 09/21/20 1436)  dexamethasone (DECADRON) injection 10 mg (10 mg Intramuscular Given 09/21/20 1445)    Initial Impression / Assessment and Plan / UC Course  I have reviewed the triage vital signs and the nursing notes. Pertinent  imaging results  that were available during my care of the patient were reviewed by me and considered in my medical decision making (see chart for details). She was unable to void despite of drinking water. She was given a cup to collect at home and bring it tomorrow. More likely has allergic bronchitis, but since she has a low grade temp, I placed her on Zpack and Allegra and was instructed how to use the Albuterol inhaler we gave her from here.  Needs to come back tomorrow for FU. See instructions  Final Clinical Impressions(s) / UC Diagnoses   Final diagnoses:  Acute bronchitis, unspecified organism     Discharge Instructions     Use the inhaler 2 puffs four times a day for wheezing for 5-7 days Start the antibiotic today and allergy medicine today Come back tomorrow, so we can recheck your oxygen. If you get worse tonight go to the hospital  Monitor your sugar toady, since the steroid shot we gave you will make it go up     ED Prescriptions    Medication Sig Dispense Auth. Provider   azithromycin (ZITHROMAX) 250 MG tablet Take first 2 tablets together, then 1 every day until finished. 6 tablet Rodriguez-Southworth, Labrittany Wechter, PA-C   fexofenadine-pseudoephedrine (ALLEGRA-D) 60-120 MG 12 hr tablet Take 1 tablet by mouth every 12 (twelve) hours. 30 tablet Rodriguez-Southworth, Nettie Elm, PA-C   fexofenadine (ALLEGRA ALLERGY) 180 MG tablet Take 1 tablet (180 mg total) by mouth daily. For allergies 30 tablet Rodriguez-Southworth, Nettie Elm, PA-C     PDMP not reviewed this encounter.   Garey Ham, Cordelia Poche 09/22/20 2223

## 2020-09-21 NOTE — ED Triage Notes (Signed)
Dry Cough since Wednesday

## 2020-09-21 NOTE — ED Notes (Signed)
Patient given water to drink.  Unable to obtain urine at this time.

## 2020-09-21 NOTE — Discharge Instructions (Signed)
Use the inhaler 2 puffs four times a day for wheezing for 5-7 days Start the antibiotic today and allergy medicine today Come back tomorrow, so we can recheck your oxygen. If you get worse tonight go to the hospital  Monitor your sugar toady, since the steroid shot we gave you will make it go up

## 2020-09-22 ENCOUNTER — Encounter: Payer: Self-pay | Admitting: Emergency Medicine

## 2020-09-22 ENCOUNTER — Ambulatory Visit
Admission: EM | Admit: 2020-09-22 | Discharge: 2020-09-22 | Disposition: A | Discharge status: Home / Self Care | Payer: Medicare Other

## 2020-09-22 DIAGNOSIS — J209 Acute bronchitis, unspecified: Secondary | ICD-10-CM | POA: Diagnosis not present

## 2020-09-22 DIAGNOSIS — R059 Cough, unspecified: Secondary | ICD-10-CM

## 2020-09-22 NOTE — ED Provider Notes (Signed)
[facility]    CSN: [id number] Arrival date & time: 09/22/20  1005      History   Chief Complaint Chief Complaint  Patient presents with  . oxygen re-check    HPI Natalie Long is a 85 y.o. female.   Reports that she is here to recheck her oxygen levels.  She does not have a pulse oximeter at home.  Was seen in this office yesterday and was treated for URI with bronchitis with azithromycin, fexofenadine, albuterol.  Initial O2 sat yesterday was 92%.  Patient reports that she is feeling better.  Has been using her inhaler and taking medication as as prescribed.  Reports that she was like her energy is returning some.  Does not have any new concerns today.  Denies headache, nausea, vomiting, diarrhea, rash, fever, other symptoms  ROS per HPI  The history is provided by the patient.    Past Medical History:  Diagnosis Date  . Anxiety   . Arthritis   . Back pain   . Constipation   . Cystocele   . Depression   . Diabetes mellitus without complication (HCC)   . Gastric reflux   . Hyperlipidemia   . Hypertension   . Rectocele   . Thyroid disease     Patient Active Problem List   Diagnosis Date Noted  . Hyperkeratosis 06/23/2013  . Metatarsalgia of left foot 03/23/2013  . PN (peripheral neuropathy) 03/23/2013  . Chronic pain 02/08/2013  . Constipation, chronic 08/09/2012  . Diabetes with proteinuria 07/24/2012  . Osteoporosis, unspecified 07/13/2012  . Callus of foot 05/12/2012  . Hypothyroidism 03/12/2012  . Osteopenia 09/10/2011  . GASTROPARESIS 02/14/2009  . INSOMNIA 07/18/2008  . DIABETES MELLITUS, TYPE II, CONTROLLED, W/NEURO COMPS 03/30/2008  . ANXIETY 06/06/2006  . DEPRESSION 06/06/2006  . MACULAR DEGENERATION 06/06/2006  . HYPERTENSION 06/06/2006  . GERD 06/06/2006  . CONSTIPATION NOS 06/06/2006  . IBS 06/06/2006  . ARTHRITIS 06/06/2006  . LOW BACK PAIN 06/06/2006    Past Surgical History:  Procedure Laterality Date  . ABDOMINAL  HYSTERECTOMY    . CATARACT EXTRACTION  2004 and 2008  . COLONOSCOPY  2004   Dr Rayann Heman  . COLONOSCOPY N/A 08/12/2012   Procedure: COLONOSCOPY;  Surgeon: Corbin Ade, MD;  Location: [facility];  Service: Endoscopy;  Laterality: N/A;  11:30  . ESOPHAGOGASTRODUODENOSCOPY  01/01/2009   XFG:[id number] hiatal hernia.Normal esophagus.Antral nodule, status post biopsy    OB History    Gravida  1   Para  1   Term  1   Preterm      AB      Living  1     SAB      IAB      Ectopic      Multiple      Live Births               Home Medications    Prior to Admission medications   Medication Sig Start Date End Date Taking? Authorizing Provider  ALPRAZolam Prudy Feeler) 0.5 MG tablet Take 0.5 mg by mouth at bedtime.  12/24/17   [provider]  amLODipine-olmesartan (AZOR) 5-20 MG tablet Take 1 tablet by mouth daily.  06/18/15   [provider]  azithromycin (ZITHROMAX) 250 MG tablet Take first 2 tablets together, then 1 every day until finished. 09/21/20   Rodriguez-Southworth, Nettie Elm, PA-C  Calcium Carbonate-Vitamin D 600-400 MG-UNIT tablet Take 2 tablets by mouth daily.    [provider]  clobetasol cream (TEMOVATE) 0.05 % APPLY TO AFFECTED AREAS TWICE DAILY. 04/04/19   Lazaro ArmsEure, Luther H, MD  cyanocobalamin 1000 MCG tablet Take 1,000 mcg by mouth daily.    [provider]  diclofenac Sodium (VOLTAREN) 1 % GEL Apply small amount to the affected area (left shoulder) as needed for pain 08/27/19   Avegno, Zachery DakinsKomlanvi S, FNP  fexofenadine (ALLEGRA ALLERGY) 180 MG tablet Take 1 tablet (180 mg total) by mouth daily. For allergies 09/21/20   Rodriguez-Southworth, Nettie ElmSylvia, PA-C  fexofenadine-pseudoephedrine (ALLEGRA-D) 60-120 MG 12 hr tablet Take 1 tablet by mouth every 12 (twelve) hours. 09/21/20   Rodriguez-Southworth, Nettie ElmSylvia, PA-C  hydrochlorothiazide (HYDRODIURIL) 25 MG tablet Take 1 tablet (25 mg total) by mouth daily. 10/06/12   Salley Scarleturham, Kawanta F, MD   HYDROcodone-acetaminophen (NORCO/VICODIN) 5-325 MG tablet Take 1-2 tablets by mouth every 6 (six) hours as needed for moderate pain. 06/23/15   [provider]  levothyroxine (SYNTHROID, LEVOTHROID) 75 MCG tablet Take 75 mcg by mouth daily before breakfast.    [provider]  metFORMIN (GLUCOPHAGE) 500 MG tablet Take 500 mg by mouth 2 (two) times daily with a meal.    [provider]  Multiple Vitamins-Minerals (VISION-VITE PRESERVE PO) Take by mouth.    [provider]  nitrofurantoin, macrocrystal-monohydrate, (MACROBID) 100 MG capsule Take 1 capsule (100 mg total) by mouth 2 (two) times daily. 08/27/19   Avegno, Zachery DakinsKomlanvi S, FNP  omeprazole (PRILOSEC) 20 MG capsule Take 20 mg by mouth daily.  06/18/15   [provider]  ONE TOUCH ULTRA TEST test strip  11/03/17   [provider]  pantoprazole (PROTONIX) 40 MG tablet Take 40 mg by mouth daily. 08/21/20   [provider]  traMADol (ULTRAM) 50 MG tablet Take 50 mg by mouth daily as needed for moderate pain or severe pain.  06/18/15   [provider]  Vitamin D, Ergocalciferol, (DRISDOL) 1.25 MG (50000 UNIT) CAPS capsule Take 50,000 Units by mouth every 7 (seven) days.    [provider]    Family History Family History  Problem Relation Age of Onset  . Osteoporosis Mother   . High blood pressure Mother   . Heart disease Father   . Hyperlipidemia Father   . Hypertension Father   . Diabetes Father   . High blood pressure Father   . Depression Sister   . Cancer Sister        metastatic  . Diabetes Sister   . Colon cancer Sister     Social History Social History   Tobacco Use  . Smoking status: Former Games developermoker  . Smokeless tobacco: Never Used  Vaping Use  . Vaping Use: Never used  Substance Use Topics  . Alcohol use: No  . Drug use: No     Allergies   Codeine   Review of Systems Review of Systems   Physical Exam Triage Vital Signs ED Triage  Vitals  Enc Vitals Group     BP 09/22/20 1015 126/65     Pulse Rate 09/22/20 1015 96     Resp 09/22/20 1015 20     Temp 09/22/20 1015 98.1 F (36.7 C)     Temp Source 09/22/20 1015 Oral     SpO2 09/22/20 1015 97 %     Weight 09/22/20 1022 170 lb (77.1 kg)     Height --      Head Circumference --      Peak Flow --  Pain Score --      Pain Loc --      Pain Edu? --      Excl. in GC? --    No data found.  Updated Vital Signs BP 126/65   Pulse 96   Temp 98.1 F (36.7 C) (Oral)   Resp 20   Wt 170 lb (77.1 kg)   SpO2 97%   BMI 26.63 kg/m       Physical Exam Vitals and nursing note reviewed.  Constitutional:      General: She is not in acute distress.    Appearance: Normal appearance. She is well-developed. She is not ill-appearing.  HENT:     Head: Normocephalic and atraumatic.     Right Ear: Tympanic membrane, ear canal and external ear normal.     Left Ear: Tympanic membrane, ear canal and external ear normal.     Nose: Nose normal.     Mouth/Throat:     Mouth: Mucous membranes are moist.     Pharynx: Oropharynx is clear.  Eyes:     Extraocular Movements: Extraocular movements intact.     Conjunctiva/sclera: Conjunctivae normal.     Pupils: Pupils are equal, round, and reactive to light.  Cardiovascular:     Rate and Rhythm: Normal rate and regular rhythm.     Heart sounds: Normal heart sounds. No murmur heard.   Pulmonary:     Effort: Pulmonary effort is normal. No respiratory distress.     Breath sounds: Normal breath sounds. No stridor. No wheezing, rhonchi or rales.  Chest:     Chest wall: No tenderness.  Abdominal:     General: Bowel sounds are normal.     Palpations: Abdomen is soft.     Tenderness: There is no abdominal tenderness.  Musculoskeletal:        General: Normal range of motion.     Cervical back: Normal range of motion and neck supple.  Skin:    General: Skin is warm and dry.     Capillary Refill: Capillary refill takes less than 2  seconds.  Neurological:     General: No focal deficit present.     Mental Status: She is alert and oriented to person, place, and time.  Psychiatric:        Mood and Affect: Mood normal.        Behavior: Behavior normal.        Thought Content: Thought content normal.      UC Treatments / Results  Labs (all labs ordered are listed, but only abnormal results are displayed) Labs Reviewed - No data to display  EKG   Radiology No results found.  Procedures Procedures (including critical care time)  Medications Ordered in UC Medications - No data to display  Initial Impression / Assessment and Plan / UC Course  I have reviewed the triage vital signs and the nursing notes.  Pertinent labs & imaging results that were available during my care of the patient were reviewed by me and considered in my medical decision making (see chart for details).    Acute bronchitis  O2 sat in office today 97% on room air Continue with medications as prescribed Follow-up with this office or with primary care as needed Follow-up with the ER for shortness of breath, high fever, trouble swallowing, trouble breathing, other concerning symptoms  Final Clinical Impressions(s) / UC Diagnoses   Final diagnoses:  Acute bronchitis, unspecified organism  Cough   Discharge Instructions   None  ED Prescriptions    None     PDMP not reviewed this encounter.   Moshe Cipro, NP 09/24/20 1210

## 2020-09-22 NOTE — ED Triage Notes (Signed)
Pts oxygen has improved.  Oxygen re-check.

## 2020-09-25 ENCOUNTER — Ambulatory Visit: Payer: Medicare Other | Admitting: Orthopaedic Surgery

## 2020-09-26 DIAGNOSIS — Z79891 Long term (current) use of opiate analgesic: Secondary | ICD-10-CM | POA: Diagnosis not present

## 2020-09-26 DIAGNOSIS — K219 Gastro-esophageal reflux disease without esophagitis: Secondary | ICD-10-CM | POA: Diagnosis not present

## 2020-09-26 DIAGNOSIS — I10 Essential (primary) hypertension: Secondary | ICD-10-CM | POA: Diagnosis not present

## 2020-09-26 DIAGNOSIS — G990 Autonomic neuropathy in diseases classified elsewhere: Secondary | ICD-10-CM | POA: Diagnosis not present

## 2020-09-26 DIAGNOSIS — E039 Hypothyroidism, unspecified: Secondary | ICD-10-CM | POA: Diagnosis not present

## 2020-09-26 DIAGNOSIS — N3281 Overactive bladder: Secondary | ICD-10-CM | POA: Diagnosis not present

## 2020-09-26 DIAGNOSIS — E1142 Type 2 diabetes mellitus with diabetic polyneuropathy: Secondary | ICD-10-CM | POA: Diagnosis not present

## 2020-09-26 DIAGNOSIS — R251 Tremor, unspecified: Secondary | ICD-10-CM | POA: Diagnosis not present

## 2020-09-26 DIAGNOSIS — D519 Vitamin B12 deficiency anemia, unspecified: Secondary | ICD-10-CM | POA: Diagnosis not present

## 2020-10-09 DIAGNOSIS — D519 Vitamin B12 deficiency anemia, unspecified: Secondary | ICD-10-CM | POA: Diagnosis not present

## 2020-11-01 ENCOUNTER — Ambulatory Visit (INDEPENDENT_AMBULATORY_CARE_PROVIDER_SITE_OTHER): Payer: Medicare Other | Admitting: Orthopaedic Surgery

## 2020-11-01 ENCOUNTER — Other Ambulatory Visit: Payer: Self-pay

## 2020-11-01 ENCOUNTER — Encounter: Payer: Self-pay | Admitting: Orthopaedic Surgery

## 2020-11-01 VITALS — BP 133/81 | HR 95 | Ht 67.0 in | Wt 169.0 lb

## 2020-11-01 DIAGNOSIS — M25512 Pain in left shoulder: Secondary | ICD-10-CM | POA: Diagnosis not present

## 2020-11-01 DIAGNOSIS — G8929 Other chronic pain: Secondary | ICD-10-CM | POA: Diagnosis not present

## 2020-11-01 NOTE — Progress Notes (Signed)
PROCEDURE NOTE:  The patient request injection, verbal consent was obtained.  The left shoulder was prepped appropriately after time out was performed.   Sterile technique was observed and injection of 1 cc of DepoMedrol 40mg  with several cc's of plain xylocaine. Anesthesia was provided by ethyl chloride and a 20-gauge needle was used to inject the shoulder area. A posterior approach was used.  The injection was tolerated well.  A band aid dressing was applied.  The patient was advised to apply ice later today and tomorrow to the injection sight as needed.  Return July 28 for injection left knee.  Electronically Signed July 30, MD 6/2/20229:00 AM

## 2020-11-06 DIAGNOSIS — D519 Vitamin B12 deficiency anemia, unspecified: Secondary | ICD-10-CM | POA: Diagnosis not present

## 2020-11-19 DIAGNOSIS — L84 Corns and callosities: Secondary | ICD-10-CM | POA: Diagnosis not present

## 2020-11-19 DIAGNOSIS — E119 Type 2 diabetes mellitus without complications: Secondary | ICD-10-CM | POA: Diagnosis not present

## 2020-12-12 DIAGNOSIS — D519 Vitamin B12 deficiency anemia, unspecified: Secondary | ICD-10-CM | POA: Diagnosis not present

## 2020-12-18 ENCOUNTER — Ambulatory Visit (INDEPENDENT_AMBULATORY_CARE_PROVIDER_SITE_OTHER): Payer: Medicare Other | Admitting: Orthopaedic Surgery

## 2020-12-18 ENCOUNTER — Encounter: Payer: Self-pay | Admitting: Orthopaedic Surgery

## 2020-12-18 ENCOUNTER — Other Ambulatory Visit: Payer: Self-pay

## 2020-12-18 VITALS — Ht 67.0 in | Wt 169.0 lb

## 2020-12-18 DIAGNOSIS — G8929 Other chronic pain: Secondary | ICD-10-CM | POA: Diagnosis not present

## 2020-12-18 DIAGNOSIS — M25561 Pain in right knee: Secondary | ICD-10-CM

## 2020-12-18 NOTE — Progress Notes (Signed)
PROCEDURE NOTE:  The patient requests injections of the right knee , verbal consent was obtained.  The right knee was prepped appropriately after time out was performed.   Sterile technique was observed and injection of 1 cc of Celestone 6mg  with several cc's of plain xylocaine. Anesthesia was provided by ethyl chloride and a 20-gauge needle was used to inject the knee area. The injection was tolerated well.  A band aid dressing was applied.  The patient was advised to apply ice later today and tomorrow to the injection sight as needed.   Return in six weeks.  Call if any problem.  Precautions discussed.  Electronically Signed , MD 7/19/20229:48 AM

## 2020-12-19 DIAGNOSIS — E119 Type 2 diabetes mellitus without complications: Secondary | ICD-10-CM | POA: Diagnosis not present

## 2020-12-19 DIAGNOSIS — E559 Vitamin D deficiency, unspecified: Secondary | ICD-10-CM | POA: Diagnosis not present

## 2020-12-19 DIAGNOSIS — I10 Essential (primary) hypertension: Secondary | ICD-10-CM | POA: Diagnosis not present

## 2020-12-26 DIAGNOSIS — K219 Gastro-esophageal reflux disease without esophagitis: Secondary | ICD-10-CM | POA: Diagnosis not present

## 2020-12-26 DIAGNOSIS — E1142 Type 2 diabetes mellitus with diabetic polyneuropathy: Secondary | ICD-10-CM | POA: Diagnosis not present

## 2020-12-26 DIAGNOSIS — G990 Autonomic neuropathy in diseases classified elsewhere: Secondary | ICD-10-CM | POA: Diagnosis not present

## 2020-12-26 DIAGNOSIS — I10 Essential (primary) hypertension: Secondary | ICD-10-CM | POA: Diagnosis not present

## 2020-12-26 DIAGNOSIS — N3281 Overactive bladder: Secondary | ICD-10-CM | POA: Diagnosis not present

## 2020-12-26 DIAGNOSIS — Z0001 Encounter for general adult medical examination with abnormal findings: Secondary | ICD-10-CM | POA: Diagnosis not present

## 2020-12-26 DIAGNOSIS — E039 Hypothyroidism, unspecified: Secondary | ICD-10-CM | POA: Diagnosis not present

## 2020-12-26 DIAGNOSIS — D519 Vitamin B12 deficiency anemia, unspecified: Secondary | ICD-10-CM | POA: Diagnosis not present

## 2020-12-26 DIAGNOSIS — M545 Low back pain, unspecified: Secondary | ICD-10-CM | POA: Diagnosis not present

## 2020-12-26 DIAGNOSIS — E782 Mixed hyperlipidemia: Secondary | ICD-10-CM | POA: Diagnosis not present

## 2021-01-01 ENCOUNTER — Ambulatory Visit: Payer: Medicare Other | Admitting: Orthopaedic Surgery

## 2021-01-09 DIAGNOSIS — M79674 Pain in right toe(s): Secondary | ICD-10-CM | POA: Diagnosis not present

## 2021-01-09 DIAGNOSIS — S93332D Other subluxation of left foot, subsequent encounter: Secondary | ICD-10-CM | POA: Diagnosis not present

## 2021-01-09 DIAGNOSIS — L110 Acquired keratosis follicularis: Secondary | ICD-10-CM | POA: Diagnosis not present

## 2021-01-09 DIAGNOSIS — M79675 Pain in left toe(s): Secondary | ICD-10-CM | POA: Diagnosis not present

## 2021-01-09 DIAGNOSIS — S93331D Other subluxation of right foot, subsequent encounter: Secondary | ICD-10-CM | POA: Diagnosis not present

## 2021-01-09 DIAGNOSIS — M79671 Pain in right foot: Secondary | ICD-10-CM | POA: Diagnosis not present

## 2021-01-09 DIAGNOSIS — M79672 Pain in left foot: Secondary | ICD-10-CM | POA: Diagnosis not present

## 2021-01-09 DIAGNOSIS — I739 Peripheral vascular disease, unspecified: Secondary | ICD-10-CM | POA: Diagnosis not present

## 2021-01-29 ENCOUNTER — Encounter: Payer: Self-pay | Admitting: Orthopaedic Surgery

## 2021-01-29 ENCOUNTER — Other Ambulatory Visit: Payer: Self-pay

## 2021-01-29 ENCOUNTER — Ambulatory Visit (INDEPENDENT_AMBULATORY_CARE_PROVIDER_SITE_OTHER): Payer: Medicare Other | Admitting: Orthopaedic Surgery

## 2021-01-29 DIAGNOSIS — M25562 Pain in left knee: Secondary | ICD-10-CM

## 2021-01-29 DIAGNOSIS — M25561 Pain in right knee: Secondary | ICD-10-CM | POA: Diagnosis not present

## 2021-01-29 DIAGNOSIS — E1121 Type 2 diabetes mellitus with diabetic nephropathy: Secondary | ICD-10-CM | POA: Diagnosis not present

## 2021-01-29 DIAGNOSIS — G8929 Other chronic pain: Secondary | ICD-10-CM

## 2021-01-29 DIAGNOSIS — S92909A Unspecified fracture of unspecified foot, initial encounter for closed fracture: Secondary | ICD-10-CM | POA: Diagnosis not present

## 2021-01-29 DIAGNOSIS — M7672 Peroneal tendinitis, left leg: Secondary | ICD-10-CM | POA: Diagnosis not present

## 2021-01-29 NOTE — Progress Notes (Signed)
PROCEDURE NOTE:  The patient requests injections of the right knee , verbal consent was obtained.  The right knee was prepped appropriately after time out was performed.   Sterile technique was observed and injection of 1 cc of Celestone 6mg  with several cc's of plain xylocaine. Anesthesia was provided by ethyl chloride and a 20-gauge needle was used to inject the knee area. The injection was tolerated well.  A band aid dressing was applied.  The patient was advised to apply ice later today and tomorrow to the injection sight as needed.   PROCEDURE NOTE:  The patient requests injections of the left knee , verbal consent was obtained.  The left knee was prepped appropriately after time out was performed.   Sterile technique was observed and injection of 1 cc of Celestone 6 mg with several cc's of plain xylocaine. Anesthesia was provided by ethyl chloride and a 20-gauge needle was used to inject the knee area. The injection was tolerated well.  A band aid dressing was applied.  The patient was advised to apply ice later today and tomorrow to the injection sight as needed.   Return in one month.  Call if any problem.  Precautions discussed.  Electronically Signed , MD 8/30/202210:13 AM

## 2021-02-06 DIAGNOSIS — Z23 Encounter for immunization: Secondary | ICD-10-CM | POA: Diagnosis not present

## 2021-02-26 ENCOUNTER — Other Ambulatory Visit: Payer: Self-pay

## 2021-02-26 ENCOUNTER — Encounter: Payer: Self-pay | Admitting: Orthopaedic Surgery

## 2021-02-26 ENCOUNTER — Ambulatory Visit (INDEPENDENT_AMBULATORY_CARE_PROVIDER_SITE_OTHER): Payer: Medicare Other | Admitting: Orthopaedic Surgery

## 2021-02-26 DIAGNOSIS — M25561 Pain in right knee: Secondary | ICD-10-CM

## 2021-02-26 DIAGNOSIS — G8929 Other chronic pain: Secondary | ICD-10-CM

## 2021-02-26 NOTE — Progress Notes (Signed)
PROCEDURE NOTE:  The patient requests injections of the right knee , verbal consent was obtained.  The right knee was prepped appropriately after time out was performed.   Sterile technique was observed and injection of 1 cc of Celestone 6mg  with several cc's of plain xylocaine. Anesthesia was provided by ethyl chloride and a 20-gauge needle was used to inject the knee area. The injection was tolerated well.  A band aid dressing was applied.  The patient was advised to apply ice later today and tomorrow to the injection sight as needed.   Encounter Diagnosis  Name Primary?   Chronic pain of right knee Yes   Return in one month.  Call if any problem.  Precautions discussed.  Electronically Signed , MD 9/27/20222:10 PM

## 2021-02-27 DIAGNOSIS — S93331D Other subluxation of right foot, subsequent encounter: Secondary | ICD-10-CM | POA: Diagnosis not present

## 2021-02-27 DIAGNOSIS — M79674 Pain in right toe(s): Secondary | ICD-10-CM | POA: Diagnosis not present

## 2021-02-27 DIAGNOSIS — M79671 Pain in right foot: Secondary | ICD-10-CM | POA: Diagnosis not present

## 2021-02-27 DIAGNOSIS — M79672 Pain in left foot: Secondary | ICD-10-CM | POA: Diagnosis not present

## 2021-02-27 DIAGNOSIS — M79675 Pain in left toe(s): Secondary | ICD-10-CM | POA: Diagnosis not present

## 2021-02-27 DIAGNOSIS — S93332D Other subluxation of left foot, subsequent encounter: Secondary | ICD-10-CM | POA: Diagnosis not present

## 2021-03-15 IMAGING — DX DG CHEST 1V PORT
1 series · 1 of 1 positions shown · non-contrast
Comparison: June 26, 2015

CLINICAL DATA: Chest pain

EXAM:
PORTABLE CHEST 1 VIEW

[chest ap]
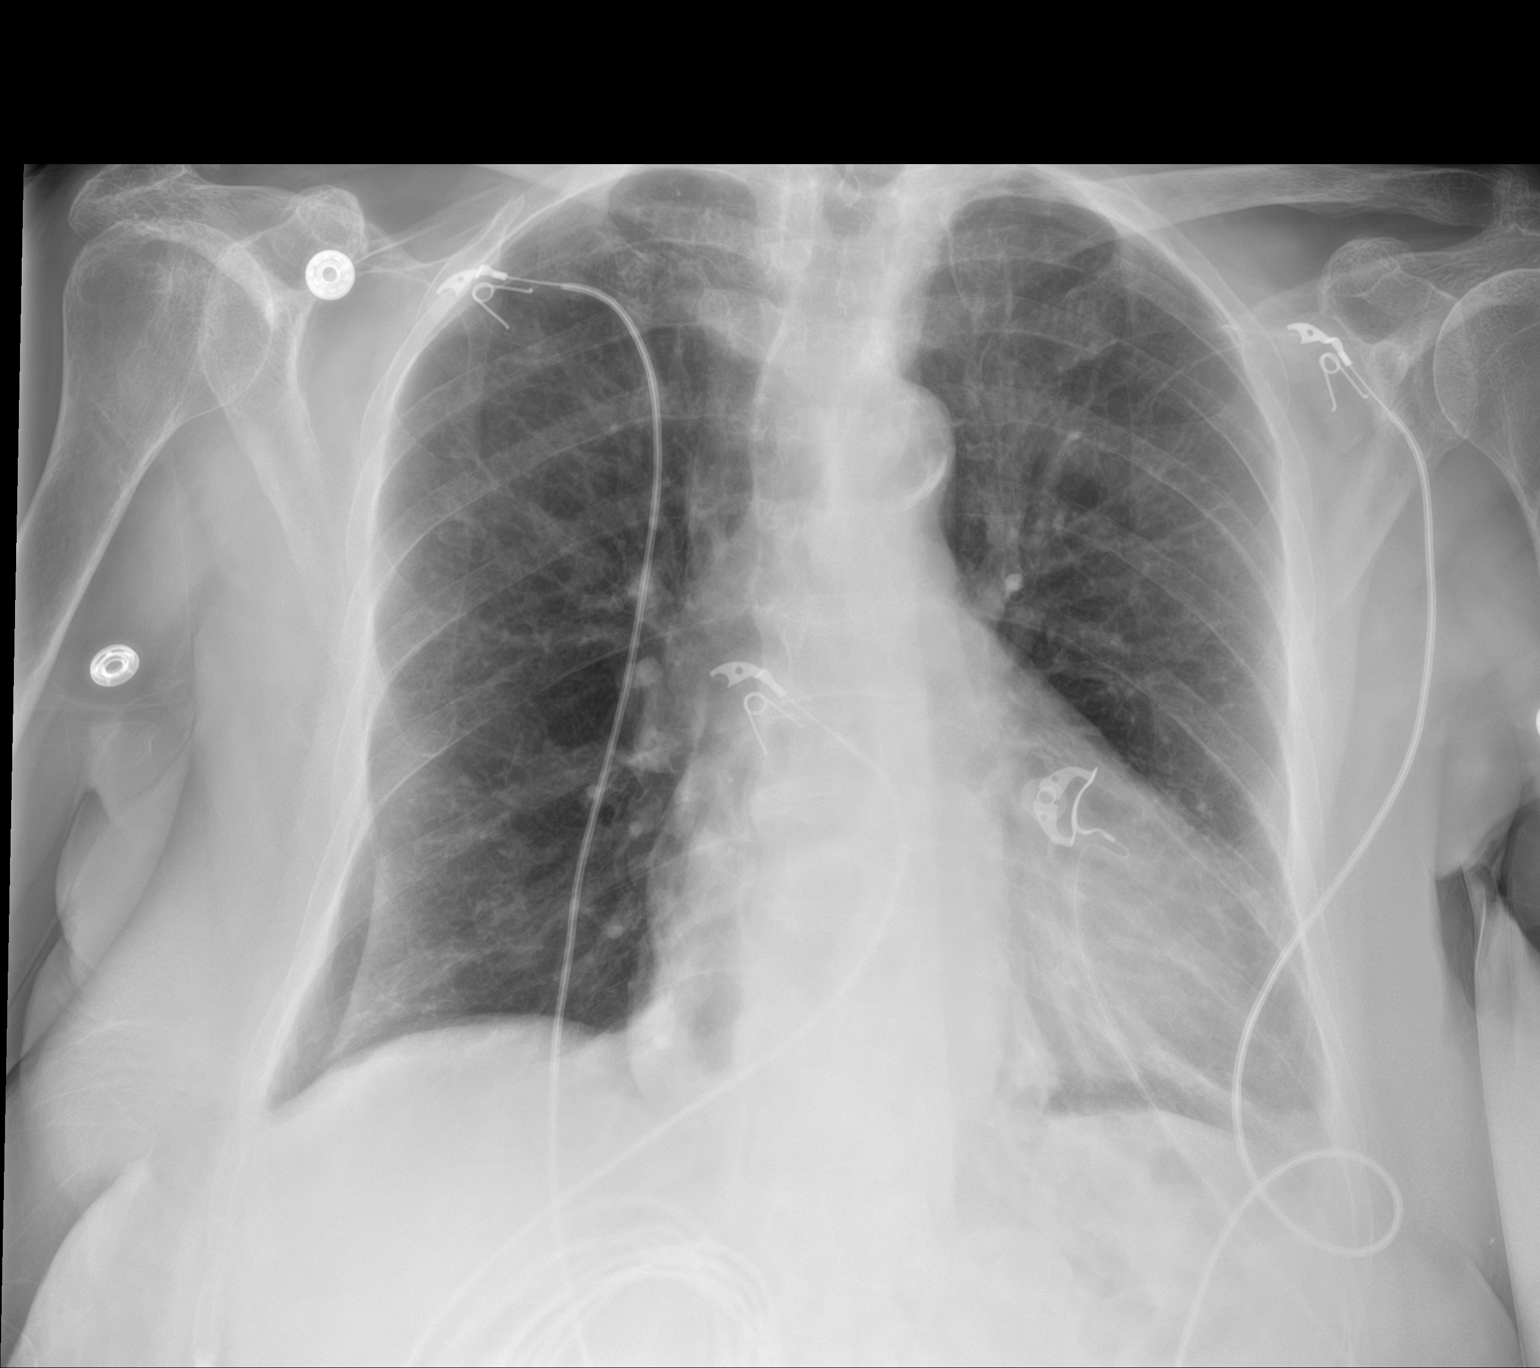

[1 of 1 positions shown; findings below may reference images not displayed]

FINDINGS: There is atelectatic change in the left base with minimal left
pleural effusion. Lungs elsewhere clear. Heart size and pulmonary
vascular within normal limits. There is a sizable hiatal type
hernia. There is aortic atherosclerosis. No adenopathy. No bone
lesions.
IMPRESSION: Left base atelectasis with minimal left pleural effusion. Lungs
elsewhere clear. Stable cardiac silhouette. Moderate hiatal hernia.
Aortic Atherosclerosis ([id number]-EEP.P).

## 2021-03-19 ENCOUNTER — Other Ambulatory Visit: Payer: Self-pay

## 2021-03-19 ENCOUNTER — Ambulatory Visit (INDEPENDENT_AMBULATORY_CARE_PROVIDER_SITE_OTHER): Payer: Medicare Other | Admitting: Orthopaedic Surgery

## 2021-03-19 ENCOUNTER — Encounter: Payer: Self-pay | Admitting: Orthopaedic Surgery

## 2021-03-19 VITALS — BP 139/76 | HR 113 | Ht 65.0 in | Wt 169.0 lb

## 2021-03-19 DIAGNOSIS — G8929 Other chronic pain: Secondary | ICD-10-CM | POA: Diagnosis not present

## 2021-03-19 DIAGNOSIS — M25561 Pain in right knee: Secondary | ICD-10-CM | POA: Diagnosis not present

## 2021-03-19 NOTE — Progress Notes (Signed)
PROCEDURE NOTE:  The patient requests injections of the right knee , verbal consent was obtained.  The right knee was prepped appropriately after time out was performed.   Sterile technique was observed and injection of 1 cc of DepoMedrol 80mg  with several cc's of plain xylocaine. Anesthesia was provided by ethyl chloride and a 20-gauge needle was used to inject the knee area. The injection was tolerated well.  A band aid dressing was applied.  The patient was advised to apply ice later today and tomorrow to the injection sight as needed.   Return in two weeks.  She has been scratching the knee and applying ointments and has broken out the skin.  I told her to use prednisone cream to the area and stop the scratching.  Encounter Diagnosis  Name Primary?   Chronic pain of right knee Yes   Call if any problem.  Precautions discussed.  Electronically Signed , MD 10/18/20223:16 PM

## 2021-03-21 ENCOUNTER — Ambulatory Visit
Admission: EM | Admit: 2021-03-21 | Discharge: 2021-03-21 | Disposition: A | Discharge status: Home / Self Care | Payer: Medicare Other | Attending: Emergency Medicine | Admitting: Emergency Medicine

## 2021-03-21 ENCOUNTER — Other Ambulatory Visit: Payer: Self-pay

## 2021-03-21 DIAGNOSIS — J069 Acute upper respiratory infection, unspecified: Secondary | ICD-10-CM

## 2021-03-21 DIAGNOSIS — J0100 Acute maxillary sinusitis, unspecified: Secondary | ICD-10-CM | POA: Diagnosis not present

## 2021-03-21 DIAGNOSIS — Z1152 Encounter for screening for COVID-19: Secondary | ICD-10-CM

## 2021-03-21 LAB — POCT INFLUENZA A/B
Influenza A, POC: NEGATIVE
Influenza B, POC: NEGATIVE

## 2021-03-21 MED ORDER — FLUTICASONE PROPIONATE 50 MCG/ACT NA SUSP: 2.0000 | Freq: Every day | NASAL | 0 refills | Status: DC

## 2021-03-21 MED ORDER — BENZONATATE 200 MG PO CAPS: 200.0000 mg | ORAL_CAPSULE | Freq: Three times a day (TID) | ORAL | 0 refills | Status: DC | PRN

## 2021-03-21 MED ORDER — AMOXICILLIN-POT CLAVULANATE 875-125 MG PO TABS: 1.0000 | ORAL_TABLET | Freq: Two times a day (BID) | ORAL | 0 refills | Status: DC

## 2021-03-21 NOTE — ED Provider Notes (Signed)
HPI  SUBJECTIVE:  Natalie Long is a 85 y.o. female who presents with 3 days of a cough, nasal congestion, rhinorrhea, sinus pain and pressure and wheezing.  No fevers, body aches, headaches, sore throat, postnasal drip, shortness of breath, nausea, vomiting, diarrhea, abdominal pain.  No known COVID or flu exposure.  She had a COVID booster and this years flu vaccine.  No antibiotics in the past 3 months.  No antipyretic in the past 6 hours.  She has tried [place] DM with improvement in her symptoms.  Her cough is worse at night.  She has a past medical history of diabetes, hypertension, hypercholesterolemia.  No history of chronic kidney disease, pulmonary disease or smoking.     She also reports itchy, dry skin on her arms and would like something for it.  HCW:CBJS, Kathleene Hazel, MD   Past Medical History:  Diagnosis Date   Anxiety    Arthritis    Back pain    Constipation    Cystocele    Depression    Diabetes mellitus without complication (HCC)    Gastric reflux    Hyperlipidemia    Hypertension    Rectocele    Thyroid disease     Past Surgical History:  Procedure Laterality Date   ABDOMINAL HYSTERECTOMY     CATARACT EXTRACTION  2004 and 2008   COLONOSCOPY  2004   Dr Rayann Heman   COLONOSCOPY N/A 08/12/2012   Procedure: COLONOSCOPY;  Surgeon: Corbin Ade, MD;  Location: [facility];  Service: Endoscopy;  Laterality: N/A;  11:30   ESOPHAGOGASTRODUODENOSCOPY  01/01/2009   [id number]:[id number] hiatal hernia.Normal esophagus.Antral nodule, status post biopsy    Family History  Problem Relation Age of Onset   Osteoporosis Mother    High blood pressure Mother    Heart disease Father    Hyperlipidemia Father    Hypertension Father    Diabetes Father    High blood pressure Father    Depression Sister    Cancer Sister        metastatic   Diabetes Sister    Colon cancer Sister     Social History   Tobacco Use   Smoking status: Former   Smokeless tobacco: Never   Building services engineer Use: Never used  Substance Use Topics   Alcohol use: No   Drug use: No     Current Facility-Administered Medications:    dexamethasone (DECADRON) injection 10 mg, 10 mg, Intravenous, Once, Rodriguez-Southworth, Sylvia, PA-C  Current Outpatient Medications:    amoxicillin-clavulanate (AUGMENTIN) 875-125 MG tablet, Take 1 tablet by mouth 2 (two) times daily. X 7 days, Disp: 14 tablet, Rfl: 0   benzonatate (TESSALON) 200 MG capsule, Take 1 capsule (200 mg total) by mouth 3 (three) times daily as needed for cough., Disp: 30 capsule, Rfl: 0   fluticasone (FLONASE) 50 MCG/ACT nasal spray, Place 2 sprays into both nostrils daily., Disp: 16 g, Rfl: 0   ALPRAZolam (XANAX) 0.5 MG tablet, Take 0.5 mg by mouth at bedtime. , Disp: , Rfl:    amLODipine-olmesartan (AZOR) 5-20 MG tablet, Take 1 tablet by mouth daily. , Disp: , Rfl:    Calcium Carbonate-Vitamin D 600-400 MG-UNIT tablet, Take 2 tablets by mouth daily., Disp: , Rfl:    clobetasol cream (TEMOVATE) 0.05 %, APPLY TO AFFECTED AREAS TWICE DAILY., Disp: 30 g, Rfl: 6   cyanocobalamin 1000 MCG tablet, Take 1,000 mcg by mouth daily., Disp: , Rfl:    diclofenac Sodium (VOLTAREN) 1 %  GEL, Apply small amount to the affected area (left shoulder) as needed for pain, Disp: 4 g, Rfl: 0   hydrochlorothiazide (HYDRODIURIL) 25 MG tablet, Take 1 tablet (25 mg total) by mouth daily., Disp: 90 tablet, Rfl: 1   levothyroxine (SYNTHROID, LEVOTHROID) 75 MCG tablet, Take 75 mcg by mouth daily before breakfast., Disp: , Rfl:    metFORMIN (GLUCOPHAGE) 500 MG tablet, Take 500 mg by mouth 2 (two) times daily with a meal., Disp: , Rfl:    Multiple Vitamins-Minerals (VISION-VITE PRESERVE PO), Take by mouth., Disp: , Rfl:    omeprazole (PRILOSEC) 20 MG capsule, Take 20 mg by mouth daily. , Disp: , Rfl:    ONE TOUCH ULTRA TEST test strip, , Disp: , Rfl:    pantoprazole (PROTONIX) 40 MG tablet, Take 40 mg by mouth daily., Disp: , Rfl:    Vitamin D,  Ergocalciferol, (DRISDOL) 1.25 MG (50000 UNIT) CAPS capsule, Take 50,000 Units by mouth every 7 (seven) days., Disp: , Rfl:   Allergies  Allergen Reactions   Codeine Nausea Only     ROS  As noted in HPI.   Physical Exam  BP (!) 155/75 (BP Location: Right Arm)   Pulse 86   Temp 97.7 F (36.5 C) (Temporal)   Resp 16   SpO2 94%   Constitutional: Well developed, well nourished, no acute distress Eyes:  EOMI, conjunctiva normal bilaterally HENT: Normocephalic, atraumatic,mucus membranes moist.  Positive nasal congestion.  Positive maxillary sinus tenderness.  Normal turbinates.  No obvious postnasal drip.  Normal oropharynx. Respiratory: Normal inspiratory effort, good air movement, lungs clear bilaterally.  No anterior or lateral chest wall tenderness. Cardiovascular: Normal rate, regular rhythm, no murmurs rubs or gallops. GI: nondistended skin: No rash, skin intact Musculoskeletal: no deformities Neurologic: Alert & oriented x 3, no focal neuro deficits Psychiatric: Speech and behavior appropriate   ED Course   Medications - No data to display  Orders Placed This Encounter  Procedures   Novel Coronavirus, NAA ([facility])    Standing Status:   Standing    Number of Occurrences:   1   SARS-COV-2, NAA 2 DAY TAT    Standing Status:   Standing    Number of Occurrences:   1   POCT Influenza A/B    Standing Status:   Standing    Number of Occurrences:   1   Results for orders placed or performed during the hospital encounter of 03/21/21  Novel Coronavirus, NAA ([facility])   Specimen: Nasopharyngeal Swab; Nasopharyngeal(NP) swabs in vial transport medium   Nasopharynge  Result Value Ref Range   SARS-CoV-2, NAA Not Detected Not Detected  SARS-COV-2, NAA 2 DAY TAT   Nasopharynge  Result Value Ref Range   SARS-CoV-2, NAA 2 DAY TAT Performed   POCT Influenza A/B  Result Value Ref Range   Influenza A, POC Negative Negative   Influenza B, POC Negative Negative     No  results found for this or any previous visit (from the past 24 hour(s)).  No results found.  ED Clinical Impression  1. Upper respiratory infection with cough and congestion   2. Encounter for screening for COVID-19   3. Acute non-recurrent maxillary sinusitis      ED Assessment/Plan  1.  Cough/URI.  Rapid flu was negative.  Did not send off formal testing as it would not change management.  COVID sent.  She will be a candidate for Molnupiravir based on age, diabetes, hypertension, hypercholesterolemia.  Her lungs are clear, she  has had no fevers, she is not hypoxic.  Doubt pneumonia, deferring chest x-ray today.  Flonase, saline nasal irrigation, Tessalon, honey and tea for the cough.  Wait-and-see prescription of Augmentin to treat a maxillary sinusitis if her COVID is negative and she is not getting better  Calculated creatinine clearance from 3/21 92 mL/min  COVID negative.   2.  She is also asking for something for itchy skin.  Advised Eucerin or CeraVe.  Follow-up with PMD as needed.  To the ER if she gets worse.  Discussed labs,MDM, treatment plan, and plan for follow-up with patient. Discussed sn/sx that should prompt return to the ED. patient agrees with plan.   Meds ordered this encounter  Medications   fluticasone (FLONASE) 50 MCG/ACT nasal spray    Sig: Place 2 sprays into both nostrils daily.    Dispense:  16 g    Refill:  0   amoxicillin-clavulanate (AUGMENTIN) 875-125 MG tablet    Sig: Take 1 tablet by mouth 2 (two) times daily. X 7 days    Dispense:  14 tablet    Refill:  0   benzonatate (TESSALON) 200 MG capsule    Sig: Take 1 capsule (200 mg total) by mouth 3 (three) times daily as needed for cough.    Dispense:  30 capsule    Refill:  0      *This clinic note was created using Scientist, clinical (histocompatibility and immunogenetics). Therefore, there may be occasional mistakes despite careful proofreading.  ?    Domenick Gong, MD 03/23/21 [phone]

## 2021-03-21 NOTE — Discharge Instructions (Addendum)
Start Mucinex to keep the mucous thin and to decongest you.   You may take 400 mg of motrin with 1000 mg of tylenol up to 3-4 times a day as needed for pain for 5 days. This is an effective combination for pain.  Most sinus infections are viral and do not need antibiotics unless you have a high fever, have had this for 10 days, or you get better and then get sick again. Use a NeilMed sinus rinse with distilled water as often as you want to to reduce nasal congestion. Follow the directions on the box.  Tessalon, honey/lemon tea for cough.  Your COVID test will be back in a day or 2.  If it is positive, I will call in a prescription for Molnupiravir.  If it is negative and you are not getting better, you can fill the Augmentin, which is an antibiotic for sinus infection.  If your COVID is positive, do not fill the Augmentin because an antibiotic will not work against a COVID infection.  Go to www.goodrx.com to look up your medications. This will give you a list of where you can find your prescriptions at the most affordable prices. Or you can ask the pharmacist what the cash price is. This is frequently cheaper than going through insurance.

## 2021-03-21 NOTE — ED Triage Notes (Signed)
Patient presents to Urgent Care with complaints of cough, body aches, and congestion x 3 days ago.   Denies fever.

## 2021-03-22 DIAGNOSIS — R059 Cough, unspecified: Secondary | ICD-10-CM | POA: Diagnosis not present

## 2021-03-22 DIAGNOSIS — R067 Sneezing: Secondary | ICD-10-CM | POA: Diagnosis not present

## 2021-03-22 DIAGNOSIS — J069 Acute upper respiratory infection, unspecified: Secondary | ICD-10-CM | POA: Diagnosis not present

## 2021-03-22 DIAGNOSIS — M791 Myalgia, unspecified site: Secondary | ICD-10-CM | POA: Diagnosis not present

## 2021-03-22 LAB — NOVEL CORONAVIRUS, NAA: SARS-CoV-2, NAA: NOT DETECTED

## 2021-03-22 LAB — SARS-COV-2, NAA 2 DAY TAT

## 2021-03-25 DIAGNOSIS — H5213 Myopia, bilateral: Secondary | ICD-10-CM | POA: Diagnosis not present

## 2021-03-26 ENCOUNTER — Ambulatory Visit: Payer: Medicare Other | Admitting: Orthopaedic Surgery

## 2021-03-26 DIAGNOSIS — R059 Cough, unspecified: Secondary | ICD-10-CM | POA: Diagnosis not present

## 2021-03-26 DIAGNOSIS — G629 Polyneuropathy, unspecified: Secondary | ICD-10-CM | POA: Diagnosis not present

## 2021-04-02 ENCOUNTER — Ambulatory Visit: Payer: Medicare Other | Admitting: Orthopaedic Surgery

## 2021-04-02 DIAGNOSIS — D519 Vitamin B12 deficiency anemia, unspecified: Secondary | ICD-10-CM | POA: Diagnosis not present

## 2021-04-02 DIAGNOSIS — E039 Hypothyroidism, unspecified: Secondary | ICD-10-CM | POA: Diagnosis not present

## 2021-04-02 DIAGNOSIS — E1142 Type 2 diabetes mellitus with diabetic polyneuropathy: Secondary | ICD-10-CM | POA: Diagnosis not present

## 2021-04-02 DIAGNOSIS — E782 Mixed hyperlipidemia: Secondary | ICD-10-CM | POA: Diagnosis not present

## 2021-04-02 DIAGNOSIS — E559 Vitamin D deficiency, unspecified: Secondary | ICD-10-CM | POA: Diagnosis not present

## 2021-04-08 DIAGNOSIS — D519 Vitamin B12 deficiency anemia, unspecified: Secondary | ICD-10-CM | POA: Diagnosis not present

## 2021-04-08 DIAGNOSIS — E782 Mixed hyperlipidemia: Secondary | ICD-10-CM | POA: Diagnosis not present

## 2021-04-08 DIAGNOSIS — G990 Autonomic neuropathy in diseases classified elsewhere: Secondary | ICD-10-CM | POA: Diagnosis not present

## 2021-04-08 DIAGNOSIS — I10 Essential (primary) hypertension: Secondary | ICD-10-CM | POA: Diagnosis not present

## 2021-04-08 DIAGNOSIS — E039 Hypothyroidism, unspecified: Secondary | ICD-10-CM | POA: Diagnosis not present

## 2021-04-08 DIAGNOSIS — E1142 Type 2 diabetes mellitus with diabetic polyneuropathy: Secondary | ICD-10-CM | POA: Diagnosis not present

## 2021-04-08 DIAGNOSIS — R251 Tremor, unspecified: Secondary | ICD-10-CM | POA: Diagnosis not present

## 2021-04-08 DIAGNOSIS — K219 Gastro-esophageal reflux disease without esophagitis: Secondary | ICD-10-CM | POA: Diagnosis not present

## 2021-04-08 DIAGNOSIS — N3281 Overactive bladder: Secondary | ICD-10-CM | POA: Diagnosis not present

## 2021-04-08 DIAGNOSIS — M545 Low back pain, unspecified: Secondary | ICD-10-CM | POA: Diagnosis not present

## 2021-04-10 DIAGNOSIS — S93332D Other subluxation of left foot, subsequent encounter: Secondary | ICD-10-CM | POA: Diagnosis not present

## 2021-04-10 DIAGNOSIS — M79675 Pain in left toe(s): Secondary | ICD-10-CM | POA: Diagnosis not present

## 2021-04-10 DIAGNOSIS — M79671 Pain in right foot: Secondary | ICD-10-CM | POA: Diagnosis not present

## 2021-04-10 DIAGNOSIS — M79674 Pain in right toe(s): Secondary | ICD-10-CM | POA: Diagnosis not present

## 2021-04-10 DIAGNOSIS — I739 Peripheral vascular disease, unspecified: Secondary | ICD-10-CM | POA: Diagnosis not present

## 2021-04-10 DIAGNOSIS — M79672 Pain in left foot: Secondary | ICD-10-CM | POA: Diagnosis not present

## 2021-04-10 DIAGNOSIS — S93331D Other subluxation of right foot, subsequent encounter: Secondary | ICD-10-CM | POA: Diagnosis not present

## 2021-04-10 DIAGNOSIS — L110 Acquired keratosis follicularis: Secondary | ICD-10-CM | POA: Diagnosis not present

## 2021-04-17 DIAGNOSIS — M810 Age-related osteoporosis without current pathological fracture: Secondary | ICD-10-CM | POA: Diagnosis not present

## 2021-05-01 DIAGNOSIS — I10 Essential (primary) hypertension: Secondary | ICD-10-CM | POA: Diagnosis not present

## 2021-05-01 DIAGNOSIS — H52223 Regular astigmatism, bilateral: Secondary | ICD-10-CM | POA: Diagnosis not present

## 2021-05-01 DIAGNOSIS — E782 Mixed hyperlipidemia: Secondary | ICD-10-CM | POA: Diagnosis not present

## 2021-05-01 DIAGNOSIS — H524 Presbyopia: Secondary | ICD-10-CM | POA: Diagnosis not present

## 2021-05-08 DIAGNOSIS — L110 Acquired keratosis follicularis: Secondary | ICD-10-CM | POA: Diagnosis not present

## 2021-05-08 DIAGNOSIS — I739 Peripheral vascular disease, unspecified: Secondary | ICD-10-CM | POA: Diagnosis not present

## 2021-05-08 DIAGNOSIS — L89892 Pressure ulcer of other site, stage 2: Secondary | ICD-10-CM | POA: Diagnosis not present

## 2021-05-08 DIAGNOSIS — M79672 Pain in left foot: Secondary | ICD-10-CM | POA: Diagnosis not present

## 2021-06-14 ENCOUNTER — Other Ambulatory Visit (HOSPITAL_COMMUNITY): Payer: Self-pay | Admitting: Internal Medicine

## 2021-06-14 DIAGNOSIS — Z1231 Encounter for screening mammogram for malignant neoplasm of breast: Secondary | ICD-10-CM

## 2021-07-10 DIAGNOSIS — E559 Vitamin D deficiency, unspecified: Secondary | ICD-10-CM | POA: Diagnosis not present

## 2021-07-10 DIAGNOSIS — E782 Mixed hyperlipidemia: Secondary | ICD-10-CM | POA: Diagnosis not present

## 2021-07-10 DIAGNOSIS — E039 Hypothyroidism, unspecified: Secondary | ICD-10-CM | POA: Diagnosis not present

## 2021-07-10 DIAGNOSIS — E1142 Type 2 diabetes mellitus with diabetic polyneuropathy: Secondary | ICD-10-CM | POA: Diagnosis not present

## 2021-07-10 DIAGNOSIS — D519 Vitamin B12 deficiency anemia, unspecified: Secondary | ICD-10-CM | POA: Diagnosis not present

## 2021-07-17 DIAGNOSIS — M545 Low back pain, unspecified: Secondary | ICD-10-CM | POA: Diagnosis not present

## 2021-07-17 DIAGNOSIS — R000 Tachycardia, unspecified: Secondary | ICD-10-CM | POA: Diagnosis not present

## 2021-07-17 DIAGNOSIS — I10 Essential (primary) hypertension: Secondary | ICD-10-CM | POA: Diagnosis not present

## 2021-07-17 DIAGNOSIS — D519 Vitamin B12 deficiency anemia, unspecified: Secondary | ICD-10-CM | POA: Diagnosis not present

## 2021-07-17 DIAGNOSIS — N3281 Overactive bladder: Secondary | ICD-10-CM | POA: Diagnosis not present

## 2021-07-17 DIAGNOSIS — E1142 Type 2 diabetes mellitus with diabetic polyneuropathy: Secondary | ICD-10-CM | POA: Diagnosis not present

## 2021-07-17 DIAGNOSIS — E782 Mixed hyperlipidemia: Secondary | ICD-10-CM | POA: Diagnosis not present

## 2021-07-17 DIAGNOSIS — K219 Gastro-esophageal reflux disease without esophagitis: Secondary | ICD-10-CM | POA: Diagnosis not present

## 2021-07-17 DIAGNOSIS — G990 Autonomic neuropathy in diseases classified elsewhere: Secondary | ICD-10-CM | POA: Diagnosis not present

## 2021-07-17 DIAGNOSIS — E039 Hypothyroidism, unspecified: Secondary | ICD-10-CM | POA: Diagnosis not present

## 2021-07-18 DIAGNOSIS — I739 Peripheral vascular disease, unspecified: Secondary | ICD-10-CM | POA: Diagnosis not present

## 2021-07-18 DIAGNOSIS — L110 Acquired keratosis follicularis: Secondary | ICD-10-CM | POA: Diagnosis not present

## 2021-07-18 DIAGNOSIS — M79672 Pain in left foot: Secondary | ICD-10-CM | POA: Diagnosis not present

## 2021-07-18 DIAGNOSIS — M79675 Pain in left toe(s): Secondary | ICD-10-CM | POA: Diagnosis not present

## 2021-07-18 DIAGNOSIS — M79674 Pain in right toe(s): Secondary | ICD-10-CM | POA: Diagnosis not present

## 2021-07-18 DIAGNOSIS — M79671 Pain in right foot: Secondary | ICD-10-CM | POA: Diagnosis not present

## 2021-07-18 DIAGNOSIS — S93331D Other subluxation of right foot, subsequent encounter: Secondary | ICD-10-CM | POA: Diagnosis not present

## 2021-07-18 DIAGNOSIS — S93332D Other subluxation of left foot, subsequent encounter: Secondary | ICD-10-CM | POA: Diagnosis not present

## 2021-07-29 ENCOUNTER — Ambulatory Visit (HOSPITAL_COMMUNITY)
Admission: RE | Admit: 2021-07-29 | Discharge: 2021-07-29 | Disposition: A | Discharge status: Home / Self Care | Payer: Medicare Other | Source: Ambulatory Visit | Attending: Internal Medicine | Admitting: Internal Medicine

## 2021-07-29 ENCOUNTER — Other Ambulatory Visit: Payer: Self-pay

## 2021-07-29 DIAGNOSIS — Z1231 Encounter for screening mammogram for malignant neoplasm of breast: Secondary | ICD-10-CM | POA: Diagnosis not present

## 2021-07-30 ENCOUNTER — Ambulatory Visit
Admission: EM | Admit: 2021-07-30 | Discharge: 2021-07-30 | Disposition: A | Discharge status: Home / Self Care | Payer: Medicare Other | Attending: Student | Admitting: Student

## 2021-07-30 ENCOUNTER — Other Ambulatory Visit: Payer: Self-pay

## 2021-07-30 DIAGNOSIS — M722 Plantar fascial fibromatosis: Secondary | ICD-10-CM

## 2021-07-30 MED ORDER — DICLOFENAC SODIUM 1 % EX GEL: 4.0000 g | Freq: Four times a day (QID) | CUTANEOUS | 0 refills | Status: AC

## 2021-07-30 MED ORDER — PREDNISONE 20 MG PO TABS: 40.0000 mg | ORAL_TABLET | Freq: Every day | ORAL | 0 refills | Status: AC

## 2021-07-30 NOTE — ED Triage Notes (Signed)
Pt reports pain in the right heel, swelling in right foot  x 2 days. Pain is worse when walking.

## 2021-07-30 NOTE — ED Provider Notes (Signed)
[facility]    CSN: [id number] Arrival date & time: 07/30/21  1226      History   Chief Complaint Chief Complaint  Patient presents with   Foot Pain    HPI Natalie Long is a 86 y.o. female presenting with right heel pain x1 week.  History metatarsalgia, arthritis of multiple joints; followed closely by Ortho and podiatry.  She last saw podiatry 1 week ago, but did not mention her symptoms at that time.  Describes pain over the calcaneus and plantar aspect of the foot, worse with ambulation and standing.  Has not tried interventions.  She is wearing the same sneakers for 1 year, states they are comfortable and wide fit.  Denies recent falls, trauma, overuse.  HPI  Past Medical History:  Diagnosis Date   Anxiety    Arthritis    Back pain    Constipation    Cystocele    Depression    Diabetes mellitus without complication ([place])    Gastric reflux    Hyperlipidemia    Hypertension    Rectocele    Thyroid disease     Patient Active Problem List   Diagnosis Date Noted   Hyperkeratosis 06/23/2013   Metatarsalgia of left foot 03/23/2013   PN (peripheral neuropathy) 03/23/2013   Chronic pain 02/08/2013   Constipation, chronic 08/09/2012   Diabetes with proteinuria 07/24/2012   Osteoporosis, unspecified 07/13/2012   Callus of foot 05/12/2012   Hypothyroidism 03/12/2012   Osteopenia 09/10/2011   GASTROPARESIS 02/14/2009   INSOMNIA 07/18/2008   DIABETES MELLITUS, TYPE II, CONTROLLED, W/NEURO COMPS 03/30/2008   ANXIETY 06/06/2006   DEPRESSION 06/06/2006   MACULAR DEGENERATION 06/06/2006   HYPERTENSION 06/06/2006   GERD 06/06/2006   CONSTIPATION NOS 06/06/2006   IBS 06/06/2006   ARTHRITIS 06/06/2006   LOW BACK PAIN 06/06/2006    Past Surgical History:  Procedure Laterality Date   ABDOMINAL HYSTERECTOMY     CATARACT EXTRACTION  2004 and 2008   COLONOSCOPY  2004   Dr Liberty Handy   COLONOSCOPY N/A 08/12/2012   Procedure: COLONOSCOPY;  Surgeon:  Daneil Dolin, MD;  Location: [facility];  Service: Endoscopy;  Laterality: N/A;  11:30   ESOPHAGOGASTRODUODENOSCOPY  01/01/2009   [id number] hiatal hernia.Normal esophagus.Antral nodule, status post biopsy    OB History     Gravida  1   Para  1   Term  1   Preterm      AB      Living  1      SAB      IAB      Ectopic      Multiple      Live Births               Home Medications    Prior to Admission medications   Medication Sig Start Date End Date Taking? Authorizing Provider  diclofenac Sodium (VOLTAREN) 1 % GEL Apply 4 g topically 4 (four) times daily. 07/30/21  Yes Hazel Sams, PA-C  predniSONE (DELTASONE) 20 MG tablet Take 2 tablets (40 mg total) by mouth daily for 5 days. Take with breakfast or lunch. Avoid NSAIDs (ibuprofen, etc) while taking this medication. 07/30/21 08/04/21 Yes Hazel Sams, PA-C  ALPRAZolam Duanne Moron) 0.5 MG tablet Take 0.5 mg by mouth at bedtime.  12/24/17   [provider]  amLODipine-olmesartan (AZOR) 5-20 MG tablet Take 1 tablet by mouth daily.  06/18/15   [provider]  Calcium Carbonate-Vitamin D 600-400  MG-UNIT tablet Take 2 tablets by mouth daily.    [provider]  clobetasol cream (TEMOVATE) 0.05 % APPLY TO AFFECTED AREAS TWICE DAILY. 04/04/19   Florian Buff, MD  cyanocobalamin 1000 MCG tablet Take 1,000 mcg by mouth daily.    [provider]  fluticasone (FLONASE) 50 MCG/ACT nasal spray Place 2 sprays into both nostrils daily. 03/21/21   Melynda Ripple, MD  hydrochlorothiazide (HYDRODIURIL) 25 MG tablet Take 1 tablet (25 mg total) by mouth daily. 10/06/12   Arroyo, Modena Nunnery, MD  levothyroxine (SYNTHROID, LEVOTHROID) 75 MCG tablet Take 75 mcg by mouth daily before breakfast.    [provider]  metFORMIN (GLUCOPHAGE) 500 MG tablet Take 500 mg by mouth 2 (two) times daily with a meal.    [provider]  Multiple Vitamins-Minerals (VISION-VITE PRESERVE PO)  Take by mouth.    [provider]  omeprazole (PRILOSEC) 20 MG capsule Take 20 mg by mouth daily.  06/18/15   [provider]  ONE TOUCH ULTRA TEST test strip  11/03/17   [provider]  pantoprazole (PROTONIX) 40 MG tablet Take 40 mg by mouth daily. 08/21/20   [provider]  Vitamin D, Ergocalciferol, (DRISDOL) 1.25 MG (50000 UNIT) CAPS capsule Take 50,000 Units by mouth every 7 (seven) days.    [provider]    Family History Family History  Problem Relation Age of Onset   Osteoporosis Mother    High blood pressure Mother    Heart disease Father    Hyperlipidemia Father    Hypertension Father    Diabetes Father    High blood pressure Father    Depression Sister    Cancer Sister        metastatic   Diabetes Sister    Colon cancer Sister     Social History Social History   Tobacco Use   Smoking status: Former   Smokeless tobacco: Never  Scientific laboratory technician Use: Never used  Substance Use Topics   Alcohol use: No   Drug use: No     Allergies   Codeine   Review of Systems Review of Systems  Musculoskeletal:        R foot pain   All other systems reviewed and are negative.   Physical Exam Triage Vital Signs ED Triage Vitals  Enc Vitals Group     BP      Pulse      Resp      Temp      Temp src      SpO2      Weight      Height      Head Circumference      Peak Flow      Pain Score      Pain Loc      Pain Edu?      Excl. in [place]?    No data found.  Updated Vital Signs BP 135/78 (BP Location: Right Arm)    Pulse (!) 109    Temp 98.5 F (36.9 C) (Oral)    Resp (!) 22    SpO2 94%   Visual Acuity Right Eye Distance:   Left Eye Distance:   Bilateral Distance:    Right Eye Near:   Left Eye Near:    Bilateral Near:     Physical Exam Vitals reviewed.  Constitutional:      General: She is not in acute distress.    Appearance: Normal appearance. She  is not ill-appearing.  HENT:     Head: Normocephalic  and atraumatic.  Cardiovascular:     Rate and Rhythm: Regular rhythm. Tachycardia present.  Pulmonary:     Effort: Pulmonary effort is normal.  Musculoskeletal:     Comments: R foot - extensive varicose veins, no swelling. No venous distension, calves equal and symmetric measuring 35cm bilaterally, negative homan sign. There is tenderness to palpation over the R plantar calcaneous and plantar fascia. Ambulating with pain. No midfoot or metatarsal pain. DP is palpable.   Neurological:     General: No focal deficit present.     Mental Status: She is alert and oriented to person, place, and time.  Psychiatric:        Mood and Affect: Mood normal.        Behavior: Behavior normal.        Thought Content: Thought content normal.        Judgment: Judgment normal.     UC Treatments / Results  Labs (all labs ordered are listed, but only abnormal results are displayed) Labs Reviewed - No data to display  EKG   Radiology MM 3D SCREEN BREAST BILATERAL  Result Date: 07/30/2021 CLINICAL DATA:  Screening. EXAM: DIGITAL SCREENING BILATERAL MAMMOGRAM WITH TOMOSYNTHESIS AND CAD TECHNIQUE: Bilateral screening digital craniocaudal and mediolateral oblique mammograms were obtained. Bilateral screening digital breast tomosynthesis was performed. The images were evaluated with computer-aided detection. COMPARISON:  Previous exam(s). ACR Breast Density Category b: There are scattered areas of fibroglandular density. FINDINGS: There are no findings suspicious for malignancy. IMPRESSION: No mammographic evidence of malignancy. A result letter of this screening mammogram will be mailed directly to the patient. RECOMMENDATION: Screening mammogram in one year. (Code:SM-B-01Y) BI-RADS CATEGORY  1: Negative. Electronically Signed   By: Valentino Saxon M.D.   On: 07/30/2021 09:23    Procedures Procedures (including critical care time)  Medications Ordered in UC Medications - No data to display  Initial  Impression / Assessment and Plan / UC Course  I have reviewed the triage vital signs and the nursing notes.  Pertinent labs & imaging results that were available during my care of the patient were reviewed by me and considered in my medical decision making (see chart for details).     This patient is a very pleasant 86 y.o. year old female presenting with plantar fasciitis R foot. Neurovascularly intact. Borderline tachy - attributes to pain. Trial of low-dose prednisone and voltaren gel. She is already closely followed by podiatry, f/u with them for additional concerns. ED return precautions discussed. Patient verbalizes understanding and agreement.     Final Clinical Impressions(s) / UC Diagnoses   Final diagnoses:  Plantar fasciitis of right foot     Discharge Instructions      -Prednisone, 2 pills taken at the same time for 5 days in a row.  Try taking this earlier in the day as it can give you energy. Avoid NSAIDs like ibuprofen and alleve while taking this medication as they can increase your risk of stomach upset and even GI bleeding when in combination with a steroid. You can continue tylenol (acetaminophen) up to 1000mg  3x daily. -Voltaren gel up to 4x daily for pain  -Rest, ice, elevation -Use comfortable shoes and limit prolonged standing and walking  -FOLLOW-UP WITH YOUR FOOT DOCTOR IF SYMPTOMS PERSIST!!     ED Prescriptions     Medication Sig Dispense Auth. Provider   predniSONE (DELTASONE) 20 MG tablet Take 2 tablets (40 mg  total) by mouth daily for 5 days. Take with breakfast or lunch. Avoid NSAIDs (ibuprofen, etc) while taking this medication. 10 tablet Hazel Sams, PA-C   diclofenac Sodium (VOLTAREN) 1 % GEL Apply 4 g topically 4 (four) times daily. 100 g Hazel Sams, PA-C      PDMP not reviewed this encounter.   Hazel Sams, PA-C 07/30/21 1259

## 2021-07-30 NOTE — Discharge Instructions (Addendum)
-  Prednisone, 2 pills taken at the same time for 5 days in a row.  Try taking this earlier in the day as it can give you energy. Avoid NSAIDs like ibuprofen and alleve while taking this medication as they can increase your risk of stomach upset and even GI bleeding when in combination with a steroid. You can continue tylenol (acetaminophen) up to 1000mg  3x daily. -Voltaren gel up to 4x daily for pain  -Rest, ice, elevation -Use comfortable shoes and limit prolonged standing and walking  -FOLLOW-UP WITH YOUR FOOT DOCTOR IF SYMPTOMS PERSIST!!

## 2021-08-08 ENCOUNTER — Ambulatory Visit: Payer: Medicare Other

## 2021-08-08 ENCOUNTER — Encounter: Payer: Self-pay | Admitting: Orthopaedic Surgery

## 2021-08-08 ENCOUNTER — Ambulatory Visit (INDEPENDENT_AMBULATORY_CARE_PROVIDER_SITE_OTHER): Payer: Medicare Other | Admitting: Orthopaedic Surgery

## 2021-08-08 ENCOUNTER — Other Ambulatory Visit: Payer: Self-pay

## 2021-08-08 VITALS — BP 107/67 | HR 92

## 2021-08-08 DIAGNOSIS — M79671 Pain in right foot: Secondary | ICD-10-CM

## 2021-08-08 NOTE — Progress Notes (Signed)
My heel hurts. ? ?She has had right heel pain for about six to eight weeks getting slowly worse.  She has pain when first getting out of bed and after sitting a while.  The pain slowly goes away with walking.  She has no trauma, no redness, no swelling. ? ?She went to Urgent Care 07-30-21.  I have reviewed the notes. She was given prednisone which did not help. ? ?She has no change in shoe wear. ? ?The right heel is tender over the mid point with no swelling, no redness.  ROM of the ankle is decreased dorsiflexion.  NV intact.  She has large varicosities of the foot and ankle.  Limp to the right.  She uses a cane. ? ?Encounter Diagnosis  ?Name Primary?  ? Pain of right heel Yes  ? ?I obtained X-rays of the right foot, reported separately. ? ?I have explained stretching exercises for her to do. ? ?She is on Ultram from another doctor.  I told her to continue this. ? ?I told her to use ice and Biofreeze or Aspercreme or Voltaren gel to the area. ? ?Return in two weeks. ? ?Call if any problem. ? ?Precautions discussed. ? ?Electronically Signed ?Darreld Mclean, MD ?3/9/202310:37 AM ? ?

## 2021-08-20 ENCOUNTER — Other Ambulatory Visit: Payer: Self-pay

## 2021-08-20 ENCOUNTER — Encounter: Payer: Self-pay | Admitting: Orthopaedic Surgery

## 2021-08-20 ENCOUNTER — Ambulatory Visit (INDEPENDENT_AMBULATORY_CARE_PROVIDER_SITE_OTHER): Payer: Medicare Other | Admitting: Orthopaedic Surgery

## 2021-08-20 VITALS — BP 146/67 | HR 95 | Ht 67.0 in | Wt 169.0 lb

## 2021-08-20 DIAGNOSIS — M79671 Pain in right foot: Secondary | ICD-10-CM | POA: Diagnosis not present

## 2021-08-20 NOTE — Progress Notes (Signed)
My heel is hurting more ? ?She has more pain of the right heel plantar area.  She has been doing the stretching exercises. She has no new trauma, no redness. ? ?Her heel is tender on the mid plantar area.  She has no redness, no swelling.  She limps.  NV intact. ? ?Encounter Diagnosis  ?Name Primary?  ? Pain of right heel Yes  ? ?Procedure note: ? ?After permission from the patient, the right heel plantar was prepped.  I injected 1 % Xylocaine and 1 cc of DepoMedrol 40 to the area of most tenderness by sterile technique tolerated well. ? ?Return in one month. ? ?Continue the stretching exercises. ? ?Call if any problem. ? ?Precautions discussed. ? ?Electronically Signed ?Sanjuana Kava, MD ?3/21/20232:43 PM ? ?

## 2021-09-17 ENCOUNTER — Encounter: Payer: Self-pay | Admitting: Orthopaedic Surgery

## 2021-09-17 ENCOUNTER — Ambulatory Visit (INDEPENDENT_AMBULATORY_CARE_PROVIDER_SITE_OTHER): Payer: Medicare Other | Admitting: Orthopaedic Surgery

## 2021-09-17 VITALS — BP 106/66 | HR 107 | Ht 67.0 in | Wt 158.6 lb

## 2021-09-17 DIAGNOSIS — M79671 Pain in right foot: Secondary | ICD-10-CM | POA: Diagnosis not present

## 2021-09-17 DIAGNOSIS — M25561 Pain in right knee: Secondary | ICD-10-CM

## 2021-09-17 DIAGNOSIS — G8929 Other chronic pain: Secondary | ICD-10-CM | POA: Diagnosis not present

## 2021-09-17 NOTE — Progress Notes (Signed)
My heel is better, my right knee hurts ? ?She has had less pain in the right heel after the injection last time.  She has pain still but not as much.  She is using ice and the stretching exercises. ? ?Her right knee is hurting more, swelling, popping but not giving way.  She has no new trauma. ? ?She uses cane.  Right heel is tender but not red and she can stand on it now without much pain.  She has marked varicose veins of the foot on the right.   ? ?The right knee has swelling, crepitus, ROM 0 to 105, limp right, stable.  She has very slight edema of both feet. ? ?Encounter Diagnoses  ?Name Primary?  ? Pain of right heel Yes  ? Chronic pain of right knee   ? ?Continue the stretching and ice for the heel. ? ?PROCEDURE NOTE: ? ?The patient requests injections of the right knee , verbal consent was obtained. ? ?The right knee was prepped appropriately after time out was performed.  ? ?Sterile technique was observed and injection of 1 cc of DepoMedrol 40mg  with several cc's of plain xylocaine. Anesthesia was provided by ethyl chloride and a 20-gauge needle was used to inject the knee area. The injection was tolerated well.  A band aid dressing was applied. ? ?The patient was advised to apply ice later today and tomorrow to the injection sight as needed. ? ?Return in two months. ? ?Call if any problem. ? ?Precautions discussed. ? ?Electronically Signed ?Sanjuana Kava, MD ?4/18/20232:26 PM ? ?

## 2021-10-08 DIAGNOSIS — M810 Age-related osteoporosis without current pathological fracture: Secondary | ICD-10-CM | POA: Diagnosis not present

## 2021-11-06 DIAGNOSIS — E559 Vitamin D deficiency, unspecified: Secondary | ICD-10-CM | POA: Diagnosis not present

## 2021-11-06 DIAGNOSIS — E039 Hypothyroidism, unspecified: Secondary | ICD-10-CM | POA: Diagnosis not present

## 2021-11-06 DIAGNOSIS — E1142 Type 2 diabetes mellitus with diabetic polyneuropathy: Secondary | ICD-10-CM | POA: Diagnosis not present

## 2021-11-06 DIAGNOSIS — D519 Vitamin B12 deficiency anemia, unspecified: Secondary | ICD-10-CM | POA: Diagnosis not present

## 2021-11-06 DIAGNOSIS — E782 Mixed hyperlipidemia: Secondary | ICD-10-CM | POA: Diagnosis not present

## 2021-11-13 DIAGNOSIS — E782 Mixed hyperlipidemia: Secondary | ICD-10-CM | POA: Diagnosis not present

## 2021-11-13 DIAGNOSIS — M545 Low back pain, unspecified: Secondary | ICD-10-CM | POA: Diagnosis not present

## 2021-11-13 DIAGNOSIS — N3281 Overactive bladder: Secondary | ICD-10-CM | POA: Diagnosis not present

## 2021-11-13 DIAGNOSIS — G990 Autonomic neuropathy in diseases classified elsewhere: Secondary | ICD-10-CM | POA: Diagnosis not present

## 2021-11-13 DIAGNOSIS — R251 Tremor, unspecified: Secondary | ICD-10-CM | POA: Diagnosis not present

## 2021-11-13 DIAGNOSIS — K219 Gastro-esophageal reflux disease without esophagitis: Secondary | ICD-10-CM | POA: Diagnosis not present

## 2021-11-13 DIAGNOSIS — D519 Vitamin B12 deficiency anemia, unspecified: Secondary | ICD-10-CM | POA: Diagnosis not present

## 2021-11-13 DIAGNOSIS — I10 Essential (primary) hypertension: Secondary | ICD-10-CM | POA: Diagnosis not present

## 2021-11-13 DIAGNOSIS — E039 Hypothyroidism, unspecified: Secondary | ICD-10-CM | POA: Diagnosis not present

## 2021-11-13 DIAGNOSIS — E1142 Type 2 diabetes mellitus with diabetic polyneuropathy: Secondary | ICD-10-CM | POA: Diagnosis not present

## 2021-11-19 ENCOUNTER — Ambulatory Visit: Payer: Medicare Other | Admitting: Orthopaedic Surgery

## 2021-12-10 ENCOUNTER — Ambulatory Visit (INDEPENDENT_AMBULATORY_CARE_PROVIDER_SITE_OTHER): Payer: Medicare Other | Admitting: Orthopaedic Surgery

## 2021-12-10 ENCOUNTER — Encounter: Payer: Self-pay | Admitting: Orthopaedic Surgery

## 2021-12-10 DIAGNOSIS — G8929 Other chronic pain: Secondary | ICD-10-CM | POA: Diagnosis not present

## 2021-12-10 DIAGNOSIS — M25561 Pain in right knee: Secondary | ICD-10-CM | POA: Diagnosis not present

## 2021-12-10 NOTE — Progress Notes (Signed)
PROCEDURE NOTE:  The patient requests injections of the right knee , verbal consent was obtained.  The right knee was prepped appropriately after time out was performed.   Sterile technique was observed and injection of 1 cc of DepoMedrol 40mg  with several cc's of plain xylocaine. Anesthesia was provided by ethyl chloride and a 20-gauge needle was used to inject the knee area. The injection was tolerated well.  A band aid dressing was applied.  The patient was advised to apply ice later today and tomorrow to the injection sight as needed.  Encounter Diagnosis  Name Primary?   Chronic pain of right knee Yes   I will see prn.  Call if any problem.  Precautions discussed.  Electronically Signed , MD 7/11/20232:52 PM

## 2022-01-27 DIAGNOSIS — E1142 Type 2 diabetes mellitus with diabetic polyneuropathy: Secondary | ICD-10-CM | POA: Diagnosis not present

## 2022-01-27 DIAGNOSIS — R2689 Other abnormalities of gait and mobility: Secondary | ICD-10-CM | POA: Diagnosis not present

## 2022-01-27 DIAGNOSIS — Z79899 Other long term (current) drug therapy: Secondary | ICD-10-CM | POA: Diagnosis not present

## 2022-01-27 DIAGNOSIS — R258 Other abnormal involuntary movements: Secondary | ICD-10-CM | POA: Diagnosis not present

## 2022-02-05 DIAGNOSIS — I10 Essential (primary) hypertension: Secondary | ICD-10-CM | POA: Diagnosis not present

## 2022-02-05 DIAGNOSIS — M25469 Effusion, unspecified knee: Secondary | ICD-10-CM | POA: Diagnosis not present

## 2022-02-05 DIAGNOSIS — Z23 Encounter for immunization: Secondary | ICD-10-CM | POA: Diagnosis not present

## 2022-02-05 DIAGNOSIS — L282 Other prurigo: Secondary | ICD-10-CM | POA: Diagnosis not present

## 2022-02-05 DIAGNOSIS — M25561 Pain in right knee: Secondary | ICD-10-CM | POA: Diagnosis not present

## 2022-03-06 ENCOUNTER — Telehealth: Payer: Self-pay | Admitting: Orthopaedic Surgery

## 2022-03-06 NOTE — Telephone Encounter (Signed)
Patient of Dr Brooke Bonito has called to request to have an injection in her right knee; last done 12/10/21.  I offered Dr Brooke Bonito first available, 03/18/22, as he is out of clinic next week. She is asking if one of other doctors can see her for the injection rather than waiting until then.

## 2022-03-06 NOTE — Telephone Encounter (Signed)
Called pt 5:27pm, no answer or voice mail to leave message; will try back.

## 2022-03-07 NOTE — Telephone Encounter (Signed)
Reached patient; scheduled appointment. Offered with one of our other providers while Dr Luna Glasgow is out next week, but patient said, due to other appointments then, she will come when Dr Luna Glasgow returns.

## 2022-03-10 DIAGNOSIS — E039 Hypothyroidism, unspecified: Secondary | ICD-10-CM | POA: Diagnosis not present

## 2022-03-10 DIAGNOSIS — D519 Vitamin B12 deficiency anemia, unspecified: Secondary | ICD-10-CM | POA: Diagnosis not present

## 2022-03-10 DIAGNOSIS — E782 Mixed hyperlipidemia: Secondary | ICD-10-CM | POA: Diagnosis not present

## 2022-03-10 DIAGNOSIS — E1142 Type 2 diabetes mellitus with diabetic polyneuropathy: Secondary | ICD-10-CM | POA: Diagnosis not present

## 2022-03-10 DIAGNOSIS — E559 Vitamin D deficiency, unspecified: Secondary | ICD-10-CM | POA: Diagnosis not present

## 2022-03-13 DIAGNOSIS — L110 Acquired keratosis follicularis: Secondary | ICD-10-CM | POA: Diagnosis not present

## 2022-03-13 DIAGNOSIS — M79675 Pain in left toe(s): Secondary | ICD-10-CM | POA: Diagnosis not present

## 2022-03-13 DIAGNOSIS — M79672 Pain in left foot: Secondary | ICD-10-CM | POA: Diagnosis not present

## 2022-03-13 DIAGNOSIS — S93332D Other subluxation of left foot, subsequent encounter: Secondary | ICD-10-CM | POA: Diagnosis not present

## 2022-03-13 DIAGNOSIS — M79671 Pain in right foot: Secondary | ICD-10-CM | POA: Diagnosis not present

## 2022-03-13 DIAGNOSIS — S93331D Other subluxation of right foot, subsequent encounter: Secondary | ICD-10-CM | POA: Diagnosis not present

## 2022-03-13 DIAGNOSIS — M79674 Pain in right toe(s): Secondary | ICD-10-CM | POA: Diagnosis not present

## 2022-03-13 DIAGNOSIS — I739 Peripheral vascular disease, unspecified: Secondary | ICD-10-CM | POA: Diagnosis not present

## 2022-03-17 DIAGNOSIS — E782 Mixed hyperlipidemia: Secondary | ICD-10-CM | POA: Diagnosis not present

## 2022-03-17 DIAGNOSIS — G990 Autonomic neuropathy in diseases classified elsewhere: Secondary | ICD-10-CM | POA: Diagnosis not present

## 2022-03-17 DIAGNOSIS — E1142 Type 2 diabetes mellitus with diabetic polyneuropathy: Secondary | ICD-10-CM | POA: Diagnosis not present

## 2022-03-17 DIAGNOSIS — D519 Vitamin B12 deficiency anemia, unspecified: Secondary | ICD-10-CM | POA: Diagnosis not present

## 2022-03-17 DIAGNOSIS — I10 Essential (primary) hypertension: Secondary | ICD-10-CM | POA: Diagnosis not present

## 2022-03-17 DIAGNOSIS — Z0000 Encounter for general adult medical examination without abnormal findings: Secondary | ICD-10-CM | POA: Diagnosis not present

## 2022-03-17 DIAGNOSIS — E039 Hypothyroidism, unspecified: Secondary | ICD-10-CM | POA: Diagnosis not present

## 2022-03-17 DIAGNOSIS — K219 Gastro-esophageal reflux disease without esophagitis: Secondary | ICD-10-CM | POA: Diagnosis not present

## 2022-03-17 DIAGNOSIS — M545 Low back pain, unspecified: Secondary | ICD-10-CM | POA: Diagnosis not present

## 2022-03-17 DIAGNOSIS — N3281 Overactive bladder: Secondary | ICD-10-CM | POA: Diagnosis not present

## 2022-03-18 ENCOUNTER — Ambulatory Visit (INDEPENDENT_AMBULATORY_CARE_PROVIDER_SITE_OTHER): Payer: Medicare Other | Admitting: Orthopaedic Surgery

## 2022-03-18 ENCOUNTER — Encounter: Payer: Self-pay | Admitting: Orthopaedic Surgery

## 2022-03-18 DIAGNOSIS — M25561 Pain in right knee: Secondary | ICD-10-CM | POA: Diagnosis not present

## 2022-03-18 DIAGNOSIS — G8929 Other chronic pain: Secondary | ICD-10-CM

## 2022-03-18 MED ORDER — METHYLPREDNISOLONE ACETATE 40 MG/ML IJ SUSP
40.0000 mg | Freq: Once | INTRAMUSCULAR | Status: AC
Start: 2022-03-18 — End: 2022-03-18
  Administered 2022-03-18: 40 mg via INTRA_ARTICULAR

## 2022-03-18 NOTE — Progress Notes (Signed)
PROCEDURE NOTE:  The patient requests injections of the right knee , verbal consent was obtained.  The right knee was prepped appropriately after time out was performed.   Sterile technique was observed and injection of 1 cc of DepoMedrol 40mg  with several cc's of plain xylocaine. Anesthesia was provided by ethyl chloride and a 20-gauge needle was used to inject the knee area. The injection was tolerated well.  A band aid dressing was applied.  The patient was advised to apply ice later today and tomorrow to the injection sight as needed.  Encounter Diagnosis  Name Primary?   Chronic pain of right knee Yes   I will see prn.  Call if any problem.  Precautions discussed.  Electronically Signed Sanjuana Kava, MD 10/17/20232:39 PM

## 2022-03-18 NOTE — Addendum Note (Signed)
Addended by: Obie Dredge A on: 03/18/2022 02:41 PM   Modules accepted: Orders

## 2022-03-26 DIAGNOSIS — E119 Type 2 diabetes mellitus without complications: Secondary | ICD-10-CM | POA: Diagnosis not present

## 2022-03-31 DIAGNOSIS — U071 COVID-19: Secondary | ICD-10-CM | POA: Diagnosis not present

## 2022-03-31 DIAGNOSIS — R059 Cough, unspecified: Secondary | ICD-10-CM | POA: Diagnosis not present

## 2022-04-13 ENCOUNTER — Ambulatory Visit
Admission: RE | Admit: 2022-04-13 | Discharge: 2022-04-13 | Disposition: A | Discharge status: Home / Self Care | Payer: Medicare Other | Source: Ambulatory Visit | Attending: Nurse Practitioner | Admitting: Nurse Practitioner

## 2022-04-13 VITALS — BP 121/73 | HR 90 | Temp 98.6°F | Resp 20

## 2022-04-13 DIAGNOSIS — R110 Nausea: Secondary | ICD-10-CM

## 2022-04-13 DIAGNOSIS — Z1152 Encounter for screening for COVID-19: Secondary | ICD-10-CM | POA: Diagnosis not present

## 2022-04-13 DIAGNOSIS — H6592 Unspecified nonsuppurative otitis media, left ear: Secondary | ICD-10-CM | POA: Diagnosis not present

## 2022-04-13 HISTORY — DX: COVID-19: U07.1

## 2022-04-13 MED ORDER — FLUTICASONE PROPIONATE 50 MCG/ACT NA SUSP: 2.0000 | Freq: Every day | NASAL | 0 refills | Status: AC

## 2022-04-13 MED ORDER — ONDANSETRON 4 MG PO TBDP: 4.0000 mg | ORAL_TABLET | Freq: Three times a day (TID) | ORAL | 0 refills | Status: AC | PRN

## 2022-04-13 NOTE — ED Provider Notes (Signed)
[facility]    CSN: [id number] Arrival date & time: 04/13/22  1249      History   Chief Complaint Chief Complaint  Patient presents with   Appointment    1300   Nausea    HPI Jesus Generaancy M Hoyos is a 86 y.o. female.   The history is provided by the patient.    Past Medical History:  Diagnosis Date   Anxiety    Arthritis    Back pain    Constipation    COVID-19    Cystocele    Depression    Diabetes mellitus without complication (HCC)    Gastric reflux    Hyperlipidemia    Hypertension    Rectocele    Thyroid disease     Patient Active Problem List   Diagnosis Date Noted   Hyperkeratosis 06/23/2013   Metatarsalgia of left foot 03/23/2013   PN (peripheral neuropathy) 03/23/2013   Chronic pain 02/08/2013   Constipation, chronic 08/09/2012   Diabetes with proteinuria 07/24/2012   Osteoporosis, unspecified 07/13/2012   Callus of foot 05/12/2012   Hypothyroidism 03/12/2012   Osteopenia 09/10/2011   GASTROPARESIS 02/14/2009   INSOMNIA 07/18/2008   DIABETES MELLITUS, TYPE II, CONTROLLED, W/NEURO COMPS 03/30/2008   ANXIETY 06/06/2006   DEPRESSION 06/06/2006   MACULAR DEGENERATION 06/06/2006   HYPERTENSION 06/06/2006   GERD 06/06/2006   CONSTIPATION NOS 06/06/2006   IBS 06/06/2006   ARTHRITIS 06/06/2006   LOW BACK PAIN 06/06/2006    Past Surgical History:  Procedure Laterality Date   ABDOMINAL HYSTERECTOMY     CATARACT EXTRACTION  2004 and 2008   COLONOSCOPY  2004   Dr Rayann Hemanehman-normal   COLONOSCOPY N/A 08/12/2012   Procedure: COLONOSCOPY;  Surgeon: Corbin Adeobert M Rourk, MD;  Location: [facility];  Service: Endoscopy;  Laterality: N/A;  11:30   ESOPHAGOGASTRODUODENOSCOPY  01/01/2009   WUJ:[id number]-[id number]:Moderate-sized hiatal hernia.Normal esophagus.Antral nodule, status post biopsy    OB History     Gravida  1   Para  1   Term  1   Preterm      AB      Living  1      SAB      IAB      Ectopic      Multiple      Live Births                Home Medications    Prior to Admission medications   Medication Sig Start Date End Date Taking? Authorizing Provider  ALPRAZolam Prudy Feeler(XANAX) 0.5 MG tablet Take 0.5 mg by mouth at bedtime.  12/24/17  Yes [provider]  amLODipine-olmesartan (AZOR) 5-20 MG tablet Take 1 tablet by mouth daily.  06/18/15  Yes [provider]  Calcium Carbonate-Vitamin D 600-400 MG-UNIT tablet Take 2 tablets by mouth daily.   Yes [provider]  cyanocobalamin 1000 MCG tablet Take 1,000 mcg by mouth daily.   Yes [provider]  fluticasone (FLONASE) 50 MCG/ACT nasal spray Place 2 sprays into both nostrils daily. 04/13/22  Yes Alyson Ki-Warren, Sadie Haberhristie J, NP  hydrochlorothiazide (HYDRODIURIL) 25 MG tablet Take 1 tablet (25 mg total) by mouth daily. 10/06/12  Yes [place], Velna HatchetKawanta F, MD  levothyroxine (SYNTHROID, LEVOTHROID) 75 MCG tablet Take 75 mcg by mouth daily before breakfast.   Yes [provider]  metFORMIN (GLUCOPHAGE) 500 MG tablet Take 500 mg by mouth 2 (two) times daily with a meal.   Yes [provider]  ondansetron (ZOFRAN-ODT) 4  MG disintegrating tablet Take 1 tablet (4 mg total) by mouth every 8 (eight) hours as needed for nausea or vomiting. 04/13/22  Yes Floree Zuniga-Warren, Sadie Haber, NP  Vitamin D, Ergocalciferol, (DRISDOL) 1.25 MG (50000 UNIT) CAPS capsule Take 50,000 Units by mouth every 7 (seven) days.   Yes [provider]  clobetasol cream (TEMOVATE) 0.05 % APPLY TO AFFECTED AREAS TWICE DAILY. 04/04/19   Lazaro Arms, MD  diclofenac Sodium (VOLTAREN) 1 % GEL Apply 4 g topically 4 (four) times daily. 07/30/21   Rhys Martini, PA-C  Multiple Vitamins-Minerals (VISION-VITE PRESERVE PO) Take by mouth.    [provider]  omeprazole (PRILOSEC) 20 MG capsule Take 20 mg by mouth daily.  06/18/15   [provider]  ONE TOUCH ULTRA TEST test strip  11/03/17   [provider]  pantoprazole (PROTONIX) 40 MG tablet Take 40 mg  by mouth daily. 08/21/20   [provider]    Family History Family History  Problem Relation Age of Onset   Osteoporosis Mother    High blood pressure Mother    Heart disease Father    Hyperlipidemia Father    Hypertension Father    Diabetes Father    High blood pressure Father    Depression Sister    Cancer Sister        metastatic   Diabetes Sister    Colon cancer Sister     Social History Social History   Tobacco Use   Smoking status: Former   Smokeless tobacco: Never  Building services engineer Use: Never used  Substance Use Topics   Alcohol use: No   Drug use: No     Allergies   Codeine   Review of Systems Review of Systems Per HPI  Physical Exam Triage Vital Signs ED Triage Vitals  Enc Vitals Group     BP 04/13/22 1338 121/73     Pulse Rate 04/13/22 1338 90     Resp 04/13/22 1338 20     Temp 04/13/22 1338 98.6 F (37 C)     Temp src --      SpO2 04/13/22 1338 97 %     Weight --      Height --      Head Circumference --      Peak Flow --      Pain Score 04/13/22 1339 0     Pain Loc --      Pain Edu? --      Excl. in GC? --    No data found.  Updated Vital Signs BP 121/73   Pulse 90   Temp 98.6 F (37 C)   Resp 20   SpO2 97%   Visual Acuity Right Eye Distance:   Left Eye Distance:   Bilateral Distance:    Right Eye Near:   Left Eye Near:    Bilateral Near:     Physical Exam Vitals and nursing note reviewed.  Constitutional:      General: She is not in acute distress.    Appearance: Normal appearance.  HENT:     Head: Normocephalic.     Right Ear: Tympanic membrane, ear canal and external ear normal.     Left Ear: Tympanic membrane, ear canal and external ear normal.     Nose: Congestion present.     Mouth/Throat:     Mouth: Mucous membranes are moist.     Pharynx: Posterior oropharyngeal erythema present.  Eyes:  Extraocular Movements: Extraocular movements intact.     Conjunctiva/sclera: Conjunctivae normal.      Pupils: Pupils are equal, round, and reactive to light.  Cardiovascular:     Rate and Rhythm: Normal rate and regular rhythm.     Pulses: Normal pulses.     Heart sounds: Normal heart sounds.  Pulmonary:     Effort: Pulmonary effort is normal. No respiratory distress.     Breath sounds: Normal breath sounds. No stridor. No wheezing, rhonchi or rales.  Abdominal:     General: Bowel sounds are normal.     Palpations: Abdomen is soft.     Tenderness: There is no abdominal tenderness.  Musculoskeletal:     Cervical back: Normal range of motion.  Lymphadenopathy:     Cervical: No cervical adenopathy.  Skin:    General: Skin is warm and dry.  Neurological:     General: No focal deficit present.     Mental Status: She is alert and oriented to person, place, and time.  Psychiatric:        Mood and Affect: Mood normal.        Behavior: Behavior normal.      UC Treatments / Results  Labs (all labs ordered are listed, but only abnormal results are displayed) Labs Reviewed  SARS CORONAVIRUS 2 (TAT 6-24 HRS)    EKG   Radiology No results found.  Procedures Procedures (including critical care time)  Medications Ordered in UC Medications - No data to display  Initial Impression / Assessment and Plan / UC Course  I have reviewed the triage vital signs and the nursing notes.  Pertinent labs & imaging results that were available during my care of the patient were reviewed by me and considered in my medical decision making (see chart for details).  Patient presents requesting follow-up COVID testing after she tested today for COVID on 03/31/2022.  She continues to experience occasional nausea and a mild cough.  Discussion with patient regarding COVID testing and the likelihood that test could remain positive.  Patient requesting COVID test for her own knowledge and reassurance.  Patient was also prescribed Zofran 4 mg to help with her nausea along with fluticasone nasal spray to  help with her middle ear effusions.  Supportive care recommendations were provided to the patient.  Patient verbalizes understanding.  All questions were answered.  Patient is stable for discharge. Final Clinical Impressions(s) / UC Diagnoses   Final diagnoses:  Encounter for screening for COVID-19  Nausea without vomiting  Middle ear effusion, left     Discharge Instructions      COVID test is pending. We will contact you if the results are positive. Take medication as prescribed. May take Tylenol as needed for pain or discomfort. Increase fluids and allow for plenty of rest. Sleep elevated on 2 pillows during sleep to help with cough. You may also use a humidifier to help with cough. Recommend a bland diet until your symptoms improve. Recommend bananas, rice, applesauce and toast. You may also eat soup, broth, yogurt, pudding until your appetite improves. Follow up with your primary care physician if your symptoms do not improve.       ED Prescriptions     Medication Sig Dispense Auth. Provider   fluticasone (FLONASE) 50 MCG/ACT nasal spray Place 2 sprays into both nostrils daily. 16 g Bilbo Carcamo-Warren, Sadie Haber, NP   ondansetron (ZOFRAN-ODT) 4 MG disintegrating tablet Take 1 tablet (4 mg total) by mouth every 8 (eight) hours  as needed for nausea or vomiting. 20 tablet Page Pucciarelli-Warren, Sadie Haber, NP      PDMP not reviewed this encounter.   Abran Cantor, NP 04/13/22 1420

## 2022-04-13 NOTE — ED Triage Notes (Addendum)
Pt states she had Covid 03/31/22 - finished course of Paxlovid, and wishes to "make sure I'm over it by getting tested again". States has improved, but continues with slight cough, some fatigue, and taste changes with some occasional nausea. Informed pt that a Covid test can continue to be positive for up to 90 days following infection, but it doesn't mean she has an active infection any longer; pt states she still wishes to be tested anyway, and is wondering if she can have Rx for nausea pills. Pt verbalizes she is very anxious, asking how long she needs to wear a mask and if she can be around others. Educated pt on CDC guidelines for Covid, reassuring her that she is no longer contagious, and no longer needs to wear a mask unless she feels more comfortable doing so. Voiced concern that she recently got Covid and RSV vaccines, but then still came down with Covid. Informed pt that getting the vaccine does not mean she will not get infected with the virus(es). Pt verbalizes regularly, "I just want to make sure I'm OK".

## 2022-04-13 NOTE — Discharge Instructions (Addendum)
COVID test is pending. We will contact you if the results are positive. Take medication as prescribed. May take Tylenol as needed for pain or discomfort. Increase fluids and allow for plenty of rest. Sleep elevated on 2 pillows during sleep to help with cough. You may also use a humidifier to help with cough. Recommend a bland diet until your symptoms improve. Recommend bananas, rice, applesauce and toast. You may also eat soup, broth, yogurt, pudding until your appetite improves. Follow up with your primary care physician if your symptoms do not improve.

## 2022-04-14 ENCOUNTER — Telehealth: Payer: Self-pay | Admitting: Emergency Medicine

## 2022-04-14 DIAGNOSIS — M810 Age-related osteoporosis without current pathological fracture: Secondary | ICD-10-CM | POA: Diagnosis not present

## 2022-04-14 NOTE — Telephone Encounter (Signed)
Pt called and inquired about covid result. Pt aware result still pending and reported would call back to UC tomorrow to discuss result.

## 2022-07-02 DIAGNOSIS — M79674 Pain in right toe(s): Secondary | ICD-10-CM | POA: Diagnosis not present

## 2022-07-02 DIAGNOSIS — S93331D Other subluxation of right foot, subsequent encounter: Secondary | ICD-10-CM | POA: Diagnosis not present

## 2022-07-02 DIAGNOSIS — M79675 Pain in left toe(s): Secondary | ICD-10-CM | POA: Diagnosis not present

## 2022-07-02 DIAGNOSIS — S93332D Other subluxation of left foot, subsequent encounter: Secondary | ICD-10-CM | POA: Diagnosis not present

## 2022-07-02 DIAGNOSIS — M79672 Pain in left foot: Secondary | ICD-10-CM | POA: Diagnosis not present

## 2022-07-02 DIAGNOSIS — L110 Acquired keratosis follicularis: Secondary | ICD-10-CM | POA: Diagnosis not present

## 2022-07-02 DIAGNOSIS — M79671 Pain in right foot: Secondary | ICD-10-CM | POA: Diagnosis not present

## 2022-07-02 DIAGNOSIS — I739 Peripheral vascular disease, unspecified: Secondary | ICD-10-CM | POA: Diagnosis not present

## 2022-07-04 ENCOUNTER — Other Ambulatory Visit (HOSPITAL_COMMUNITY): Payer: Self-pay | Admitting: Internal Medicine

## 2022-07-04 DIAGNOSIS — Z1231 Encounter for screening mammogram for malignant neoplasm of breast: Secondary | ICD-10-CM

## 2022-07-15 DIAGNOSIS — E559 Vitamin D deficiency, unspecified: Secondary | ICD-10-CM | POA: Diagnosis not present

## 2022-07-15 DIAGNOSIS — E1142 Type 2 diabetes mellitus with diabetic polyneuropathy: Secondary | ICD-10-CM | POA: Diagnosis not present

## 2022-07-15 DIAGNOSIS — E782 Mixed hyperlipidemia: Secondary | ICD-10-CM | POA: Diagnosis not present

## 2022-07-15 DIAGNOSIS — D519 Vitamin B12 deficiency anemia, unspecified: Secondary | ICD-10-CM | POA: Diagnosis not present

## 2022-07-15 DIAGNOSIS — E039 Hypothyroidism, unspecified: Secondary | ICD-10-CM | POA: Diagnosis not present

## 2022-07-22 DIAGNOSIS — R251 Tremor, unspecified: Secondary | ICD-10-CM | POA: Diagnosis not present

## 2022-07-22 DIAGNOSIS — E782 Mixed hyperlipidemia: Secondary | ICD-10-CM | POA: Diagnosis not present

## 2022-07-22 DIAGNOSIS — N3281 Overactive bladder: Secondary | ICD-10-CM | POA: Diagnosis not present

## 2022-07-22 DIAGNOSIS — E039 Hypothyroidism, unspecified: Secondary | ICD-10-CM | POA: Diagnosis not present

## 2022-07-22 DIAGNOSIS — M545 Low back pain, unspecified: Secondary | ICD-10-CM | POA: Diagnosis not present

## 2022-07-22 DIAGNOSIS — E1169 Type 2 diabetes mellitus with other specified complication: Secondary | ICD-10-CM | POA: Diagnosis not present

## 2022-07-22 DIAGNOSIS — I10 Essential (primary) hypertension: Secondary | ICD-10-CM | POA: Diagnosis not present

## 2022-07-22 DIAGNOSIS — E1143 Type 2 diabetes mellitus with diabetic autonomic (poly)neuropathy: Secondary | ICD-10-CM | POA: Diagnosis not present

## 2022-07-22 DIAGNOSIS — G990 Autonomic neuropathy in diseases classified elsewhere: Secondary | ICD-10-CM | POA: Diagnosis not present

## 2022-07-22 DIAGNOSIS — E1142 Type 2 diabetes mellitus with diabetic polyneuropathy: Secondary | ICD-10-CM | POA: Diagnosis not present

## 2022-07-22 DIAGNOSIS — D519 Vitamin B12 deficiency anemia, unspecified: Secondary | ICD-10-CM | POA: Diagnosis not present

## 2022-07-30 DIAGNOSIS — Z1211 Encounter for screening for malignant neoplasm of colon: Secondary | ICD-10-CM | POA: Diagnosis not present

## 2022-08-01 ENCOUNTER — Ambulatory Visit (HOSPITAL_COMMUNITY): Payer: Medicare Other

## 2022-08-06 ENCOUNTER — Ambulatory Visit (HOSPITAL_COMMUNITY): Payer: Medicare Other

## 2022-10-10 DIAGNOSIS — M810 Age-related osteoporosis without current pathological fracture: Secondary | ICD-10-CM | POA: Diagnosis not present

## 2022-10-22 DIAGNOSIS — M79672 Pain in left foot: Secondary | ICD-10-CM | POA: Diagnosis not present

## 2022-10-22 DIAGNOSIS — S93331D Other subluxation of right foot, subsequent encounter: Secondary | ICD-10-CM | POA: Diagnosis not present

## 2022-10-22 DIAGNOSIS — M79675 Pain in left toe(s): Secondary | ICD-10-CM | POA: Diagnosis not present

## 2022-10-22 DIAGNOSIS — M79671 Pain in right foot: Secondary | ICD-10-CM | POA: Diagnosis not present

## 2022-10-22 DIAGNOSIS — L110 Acquired keratosis follicularis: Secondary | ICD-10-CM | POA: Diagnosis not present

## 2022-10-22 DIAGNOSIS — I739 Peripheral vascular disease, unspecified: Secondary | ICD-10-CM | POA: Diagnosis not present

## 2022-10-22 DIAGNOSIS — S93332D Other subluxation of left foot, subsequent encounter: Secondary | ICD-10-CM | POA: Diagnosis not present

## 2022-10-22 DIAGNOSIS — M79674 Pain in right toe(s): Secondary | ICD-10-CM | POA: Diagnosis not present

## 2022-10-29 ENCOUNTER — Ambulatory Visit (INDEPENDENT_AMBULATORY_CARE_PROVIDER_SITE_OTHER): Payer: Medicare Other | Admitting: Orthopaedic Surgery

## 2022-10-29 ENCOUNTER — Encounter: Payer: Self-pay | Admitting: Orthopaedic Surgery

## 2022-10-29 DIAGNOSIS — M25561 Pain in right knee: Secondary | ICD-10-CM

## 2022-10-29 DIAGNOSIS — G8929 Other chronic pain: Secondary | ICD-10-CM

## 2022-10-29 NOTE — Progress Notes (Signed)
PROCEDURE NOTE:  The patient requests injections of the right knee , verbal consent was obtained.  The right knee was prepped appropriately after time out was performed.   Sterile technique was observed and injection of 1 cc of DepoMedrol 40mg  with several cc's of plain xylocaine. Anesthesia was provided by ethyl chloride and a 20-gauge needle was used to inject the knee area. The injection was tolerated well.  A band aid dressing was applied.  The patient was advised to apply ice later today and tomorrow to the injection sight as needed.  Encounter Diagnosis  Name Primary?   Chronic pain of right knee Yes   Return prn  Call if any problem.  Precautions discussed.  Electronically Signed Darreld Mclean, MD 5/29/20248:54 AM

## 2022-11-13 DIAGNOSIS — E1142 Type 2 diabetes mellitus with diabetic polyneuropathy: Secondary | ICD-10-CM | POA: Diagnosis not present

## 2022-11-13 DIAGNOSIS — D519 Vitamin B12 deficiency anemia, unspecified: Secondary | ICD-10-CM | POA: Diagnosis not present

## 2022-11-13 DIAGNOSIS — E782 Mixed hyperlipidemia: Secondary | ICD-10-CM | POA: Diagnosis not present

## 2022-11-13 DIAGNOSIS — E039 Hypothyroidism, unspecified: Secondary | ICD-10-CM | POA: Diagnosis not present

## 2022-11-20 ENCOUNTER — Other Ambulatory Visit (HOSPITAL_COMMUNITY): Payer: Self-pay | Admitting: Internal Medicine

## 2022-11-20 DIAGNOSIS — M25512 Pain in left shoulder: Secondary | ICD-10-CM | POA: Diagnosis not present

## 2022-11-20 DIAGNOSIS — E039 Hypothyroidism, unspecified: Secondary | ICD-10-CM | POA: Diagnosis not present

## 2022-11-20 DIAGNOSIS — R000 Tachycardia, unspecified: Secondary | ICD-10-CM | POA: Diagnosis not present

## 2022-11-20 DIAGNOSIS — I700 Atherosclerosis of aorta: Secondary | ICD-10-CM | POA: Diagnosis not present

## 2022-11-20 DIAGNOSIS — E1142 Type 2 diabetes mellitus with diabetic polyneuropathy: Secondary | ICD-10-CM | POA: Diagnosis not present

## 2022-11-20 DIAGNOSIS — M25561 Pain in right knee: Secondary | ICD-10-CM | POA: Diagnosis not present

## 2022-11-20 DIAGNOSIS — G990 Autonomic neuropathy in diseases classified elsewhere: Secondary | ICD-10-CM | POA: Diagnosis not present

## 2022-11-20 DIAGNOSIS — K219 Gastro-esophageal reflux disease without esophagitis: Secondary | ICD-10-CM | POA: Diagnosis not present

## 2022-11-20 DIAGNOSIS — E782 Mixed hyperlipidemia: Secondary | ICD-10-CM | POA: Diagnosis not present

## 2022-11-20 DIAGNOSIS — I10 Essential (primary) hypertension: Secondary | ICD-10-CM | POA: Diagnosis not present

## 2022-11-20 DIAGNOSIS — M810 Age-related osteoporosis without current pathological fracture: Secondary | ICD-10-CM

## 2022-11-20 DIAGNOSIS — K449 Diaphragmatic hernia without obstruction or gangrene: Secondary | ICD-10-CM | POA: Diagnosis not present

## 2022-11-25 ENCOUNTER — Ambulatory Visit: Payer: Medicare Other | Admitting: Orthopaedic Surgery

## 2022-12-03 ENCOUNTER — Ambulatory Visit
Admission: EM | Admit: 2022-12-03 | Discharge: 2022-12-03 | Disposition: A | Discharge status: Home / Self Care | Payer: Medicare Other | Attending: Family Medicine | Admitting: Family Medicine

## 2022-12-03 DIAGNOSIS — M79601 Pain in right arm: Secondary | ICD-10-CM

## 2022-12-03 MED ORDER — DICLOFENAC SODIUM 1 % EX GEL: 2.0000 g | Freq: Four times a day (QID) | CUTANEOUS | 0 refills | Status: AC

## 2022-12-03 MED ORDER — KETOROLAC TROMETHAMINE 30 MG/ML IJ SOLN
15.0000 mg | Freq: Once | INTRAMUSCULAR | Status: AC
  Administered 2022-12-03: 15 mg via INTRAMUSCULAR

## 2022-12-03 NOTE — ED Provider Notes (Signed)
[facility]    CSN: [id number] Arrival date & time: 12/03/22  1626      History   Chief Complaint No chief complaint on file.   HPI Natalie Long is a 87 y.o. female.   Patient presenting today with 3-day history of significant right arm pain from the shoulder down as well as stiffness with movement.  She is also having some pain in the neck on the right side.  Denies any known injury, swelling, discoloration, numbness, tingling.  States she does have a known history of osteoarthritis and thinks that it is flaring up.  So far not tried anything over-the-counter for symptoms.  Is requesting an injection for this today.    Past Medical History:  Diagnosis Date   Anxiety    Arthritis    Back pain    Constipation    COVID-19    Cystocele    Depression    Diabetes mellitus without complication (HCC)    Gastric reflux    Hyperlipidemia    Hypertension    Rectocele    Thyroid disease     Patient Active Problem List   Diagnosis Date Noted   Hyperkeratosis 06/23/2013   Metatarsalgia of left foot 03/23/2013   PN (peripheral neuropathy) 03/23/2013   Chronic pain 02/08/2013   Constipation, chronic 08/09/2012   Diabetes with proteinuria 07/24/2012   Osteoporosis, unspecified 07/13/2012   Callus of foot 05/12/2012   Hypothyroidism 03/12/2012   Osteopenia 09/10/2011   GASTROPARESIS 02/14/2009   INSOMNIA 07/18/2008   DIABETES MELLITUS, TYPE II, CONTROLLED, W/NEURO COMPS 03/30/2008   ANXIETY 06/06/2006   DEPRESSION 06/06/2006   MACULAR DEGENERATION 06/06/2006   HYPERTENSION 06/06/2006   GERD 06/06/2006   CONSTIPATION NOS 06/06/2006   IBS 06/06/2006   ARTHRITIS 06/06/2006   LOW BACK PAIN 06/06/2006    Past Surgical History:  Procedure Laterality Date   ABDOMINAL HYSTERECTOMY     CATARACT EXTRACTION  2004 and 2008   COLONOSCOPY  2004   Dr Rayann Heman   COLONOSCOPY N/A 08/12/2012   Procedure: COLONOSCOPY;  Surgeon: Corbin Ade, MD;  Location: [facility];  Service: Endoscopy;  Laterality: N/A;  11:30   ESOPHAGOGASTRODUODENOSCOPY  01/01/2009   WUJ:[id number]-GNFAO hiatal hernia.Normal esophagus.Antral nodule, status post biopsy    OB History     Gravida  1   Para  1   Term  1   Preterm      AB      Living  1      SAB      IAB      Ectopic      Multiple      Live Births               Home Medications    Prior to Admission medications   Medication Sig Start Date End Date Taking? Authorizing Provider  diclofenac Sodium (VOLTAREN) 1 % GEL Apply 2 g topically 4 (four) times daily. 12/03/22  Yes Particia Nearing, PA-C  ALPRAZolam Prudy Feeler) 0.5 MG tablet Take 0.5 mg by mouth at bedtime.  12/24/17   [provider]  amLODipine-olmesartan (AZOR) 5-20 MG tablet Take 1 tablet by mouth daily.  06/18/15   [provider]  Calcium Carbonate-Vitamin D 600-400 MG-UNIT tablet Take 2 tablets by mouth daily.    [provider]  clobetasol cream (TEMOVATE) 0.05 % APPLY TO AFFECTED AREAS TWICE DAILY. 04/04/19   Lazaro Arms, MD  cyanocobalamin 1000 MCG tablet Take 1,000 mcg by  mouth daily.    [provider]  diclofenac Sodium (VOLTAREN) 1 % GEL Apply 4 g topically 4 (four) times daily. 07/30/21   Rhys Martini, PA-C  fluticasone (FLONASE) 50 MCG/ACT nasal spray Place 2 sprays into both nostrils daily. 04/13/22   Leath-Warren, Sadie Haber, NP  hydrochlorothiazide (HYDRODIURIL) 25 MG tablet Take 1 tablet (25 mg total) by mouth daily. Patient not taking: Reported on 10/29/2022 10/06/12   [name], MD  levothyroxine (SYNTHROID, LEVOTHROID) 75 MCG tablet Take 75 mcg by mouth daily before breakfast.    [provider]  metFORMIN (GLUCOPHAGE) 500 MG tablet Take 500 mg by mouth 2 (two) times daily with a meal.    [provider]  Multiple Vitamins-Minerals (VISION-VITE PRESERVE PO) Take by mouth.    [provider]  omeprazole (PRILOSEC) 20 MG capsule Take 20 mg  by mouth daily.  06/18/15   [provider]  ondansetron (ZOFRAN-ODT) 4 MG disintegrating tablet Take 1 tablet (4 mg total) by mouth every 8 (eight) hours as needed for nausea or vomiting. Patient not taking: Reported on 10/29/2022 04/13/22   Leath-Warren, Sadie Haber, NP  ONE TOUCH ULTRA TEST test strip  11/03/17   [provider]  pantoprazole (PROTONIX) 40 MG tablet Take 40 mg by mouth daily. 08/21/20   [provider]  PROLIA 60 MG/ML SOSY injection SMARTSIG:1 Milliliter(s) SUB-Q Twice a Year 10/07/22   [provider]  Vitamin D, Ergocalciferol, (DRISDOL) 1.25 MG (50000 UNIT) CAPS capsule Take 50,000 Units by mouth every 7 (seven) days.    [provider]    Family History Family History  Problem Relation Age of Onset   Osteoporosis Mother    High blood pressure Mother    Heart disease Father    Hyperlipidemia Father    Hypertension Father    Diabetes Father    High blood pressure Father    Depression Sister    Cancer Sister        metastatic   Diabetes Sister    Colon cancer Sister     Social History Social History   Tobacco Use   Smoking status: Former   Smokeless tobacco: Never  Building services engineer Use: Never used  Substance Use Topics   Alcohol use: No   Drug use: No     Allergies   Codeine   Review of Systems Review of Systems Per HPI  Physical Exam Triage Vital Signs ED Triage Vitals  Enc Vitals Group     BP 12/03/22 1632 130/71     Pulse Rate 12/03/22 1631 96     Resp 12/03/22 1631 16     Temp 12/03/22 1631 98.2 F (36.8 C)     Temp Source 12/03/22 1631 Oral     SpO2 12/03/22 1631 93 %     Weight --      Height --      Head Circumference --      Peak Flow --      Pain Score 12/03/22 1633 10     Pain Loc --      Pain Edu? --      Excl. in GC? --    No data found.  Updated Vital Signs BP 130/71 (BP Location: Right Arm)   Pulse 96   Temp 98.2 F (36.8 C) (Oral)   Resp 16   SpO2 93%   Visual  Acuity Right Eye Distance:   Left Eye Distance:   Bilateral Distance:  Right Eye Near:   Left Eye Near:    Bilateral Near:     Physical Exam Vitals and nursing note reviewed.  Constitutional:      Appearance: Normal appearance. She is not ill-appearing.  HENT:     Head: Atraumatic.     Mouth/Throat:     Mouth: Mucous membranes are moist.  Eyes:     Extraocular Movements: Extraocular movements intact.     Conjunctiva/sclera: Conjunctivae normal.  Cardiovascular:     Rate and Rhythm: Normal rate and regular rhythm.     Heart sounds: Normal heart sounds.  Pulmonary:     Effort: Pulmonary effort is normal.     Breath sounds: Normal breath sounds.  Musculoskeletal:        General: Tenderness present. No swelling, deformity or signs of injury. Normal range of motion.     Cervical back: Normal range of motion and neck supple.     Comments: No midline spinal tenderness to palpation diffusely.  Right cervical paraspinal muscles, trapezius and down the extent of right upper extremity tender to palpation with no point tenderness or deformities palpable.  No edema, discoloration  Skin:    General: Skin is warm and dry.     Findings: No bruising or erythema.  Neurological:     Mental Status: She is alert and oriented to person, place, and time.     Comments: Bilateral upper extremities neurovascularly intact  Psychiatric:        Mood and Affect: Mood normal.        Thought Content: Thought content normal.        Judgment: Judgment normal.      UC Treatments / Results  Labs (all labs ordered are listed, but only abnormal results are displayed) Labs Reviewed - No data to display  EKG   Radiology No results found.  Procedures Procedures (including critical care time)  Medications Ordered in UC Medications  ketorolac (TORADOL) 30 MG/ML injection 15 mg (15 mg Intramuscular Given 12/03/22 1715)    Initial Impression / Assessment and Plan / UC Course  I have reviewed the  triage vital signs and the nursing notes.  Pertinent labs & imaging results that were available during my care of the patient were reviewed by me and considered in my medical decision making (see chart for details).     Suspect some arthritis inflammation causing nerve impingement at the shoulder.  She is requesting an injection so we will give IM Toradol at a low dose today and additionally treat at home with Voltaren gel, Tylenol, heat, massage.  Return for worsening symptoms.  Final Clinical Impressions(s) / UC Diagnoses   Final diagnoses:  Right arm pain     Discharge Instructions      We have given you a shot of Anti-inflammatory pain medication today that should stay in your system for about 48 hours.  You may also apply Voltaren gel to the areas of pain, and take Tylenol up to 4 times daily    ED Prescriptions     Medication Sig Dispense Auth. Provider   diclofenac Sodium (VOLTAREN) 1 % GEL Apply 2 g topically 4 (four) times daily. 150 g Particia Nearing, [place]      PDMP not reviewed this encounter.   Particia Nearing, [place] 12/03/22 1922

## 2022-12-03 NOTE — ED Triage Notes (Signed)
Pt reports her right arm is painful and at times she cannot lift it x 3 days.

## 2022-12-03 NOTE — Discharge Instructions (Signed)
We have given you a shot of Anti-inflammatory pain medication today that should stay in your system for about 48 hours.  You may also apply Voltaren gel to the areas of pain, and take Tylenol up to 4 times daily

## 2022-12-15 ENCOUNTER — Encounter: Payer: Self-pay | Admitting: *Deleted

## 2022-12-17 ENCOUNTER — Ambulatory Visit (HOSPITAL_COMMUNITY)
Admission: RE | Admit: 2022-12-17 | Discharge: 2022-12-17 | Disposition: A | Discharge status: Home / Self Care | Payer: Medicare Other | Source: Ambulatory Visit | Attending: Internal Medicine | Admitting: Internal Medicine

## 2022-12-17 DIAGNOSIS — M810 Age-related osteoporosis without current pathological fracture: Secondary | ICD-10-CM | POA: Insufficient documentation

## 2022-12-30 DIAGNOSIS — S93332D Other subluxation of left foot, subsequent encounter: Secondary | ICD-10-CM | POA: Diagnosis not present

## 2022-12-30 DIAGNOSIS — M79675 Pain in left toe(s): Secondary | ICD-10-CM | POA: Diagnosis not present

## 2022-12-30 DIAGNOSIS — M79674 Pain in right toe(s): Secondary | ICD-10-CM | POA: Diagnosis not present

## 2022-12-30 DIAGNOSIS — M79671 Pain in right foot: Secondary | ICD-10-CM | POA: Diagnosis not present

## 2022-12-30 DIAGNOSIS — L110 Acquired keratosis follicularis: Secondary | ICD-10-CM | POA: Diagnosis not present

## 2022-12-30 DIAGNOSIS — I739 Peripheral vascular disease, unspecified: Secondary | ICD-10-CM | POA: Diagnosis not present

## 2022-12-30 DIAGNOSIS — S93331D Other subluxation of right foot, subsequent encounter: Secondary | ICD-10-CM | POA: Diagnosis not present

## 2022-12-30 DIAGNOSIS — M79672 Pain in left foot: Secondary | ICD-10-CM | POA: Diagnosis not present

## 2023-01-06 ENCOUNTER — Encounter: Payer: Self-pay | Admitting: Orthopaedic Surgery

## 2023-01-06 ENCOUNTER — Ambulatory Visit (INDEPENDENT_AMBULATORY_CARE_PROVIDER_SITE_OTHER): Payer: Medicare Other | Admitting: Orthopaedic Surgery

## 2023-01-06 VITALS — BP 128/78 | HR 114 | Temp 98.9°F | Ht 67.0 in | Wt 150.0 lb

## 2023-01-06 DIAGNOSIS — M25511 Pain in right shoulder: Secondary | ICD-10-CM

## 2023-01-06 DIAGNOSIS — M25512 Pain in left shoulder: Secondary | ICD-10-CM

## 2023-01-06 DIAGNOSIS — G8929 Other chronic pain: Secondary | ICD-10-CM

## 2023-01-06 NOTE — Progress Notes (Signed)
PROCEDURE NOTE:  The patient request injection, verbal consent was obtained.  The right shoulder was prepped appropriately after time out was performed.   Sterile technique was observed and injection of 1 cc of DepoMedrol 40mg  with several cc's of plain xylocaine. Anesthesia was provided by ethyl chloride and a 20-gauge needle was used to inject the shoulder area. A posterior approach was used.  The injection was tolerated well.  A band aid dressing was applied.  The patient was advised to apply ice later today and tomorrow to the injection sight as needed.   PROCEDURE NOTE:  The patient request injection, verbal consent was obtained.  The left shoulder was prepped appropriately after time out was performed.   Sterile technique was observed and injection of 1 cc of DepoMedrol 40mg  with several cc's of plain xylocaine. Anesthesia was provided by ethyl chloride and a 20-gauge needle was used to inject the shoulder area. A posterior approach was used.  The injection was tolerated well.  A band aid dressing was applied.  The patient was advised to apply ice later today and tomorrow to the injection sight as needed.   Encounter Diagnosis  Name Primary?   Chronic pain of both shoulders Yes   Return prn  Call if any problem.  Precautions discussed.  Electronically Signed Darreld Mclean, MD 8/6/20249:18 AM

## 2023-02-19 DIAGNOSIS — D519 Vitamin B12 deficiency anemia, unspecified: Secondary | ICD-10-CM | POA: Diagnosis not present

## 2023-02-19 DIAGNOSIS — E1142 Type 2 diabetes mellitus with diabetic polyneuropathy: Secondary | ICD-10-CM | POA: Diagnosis not present

## 2023-02-19 DIAGNOSIS — E782 Mixed hyperlipidemia: Secondary | ICD-10-CM | POA: Diagnosis not present

## 2023-02-19 DIAGNOSIS — E039 Hypothyroidism, unspecified: Secondary | ICD-10-CM | POA: Diagnosis not present

## 2023-02-25 ENCOUNTER — Encounter: Payer: Self-pay | Admitting: Orthopedic Surgery

## 2023-02-25 ENCOUNTER — Ambulatory Visit (INDEPENDENT_AMBULATORY_CARE_PROVIDER_SITE_OTHER): Payer: Medicare Other | Admitting: Orthopedic Surgery

## 2023-02-25 DIAGNOSIS — G8929 Other chronic pain: Secondary | ICD-10-CM

## 2023-02-25 DIAGNOSIS — M25561 Pain in right knee: Secondary | ICD-10-CM | POA: Diagnosis not present

## 2023-02-25 NOTE — Progress Notes (Signed)
Orthopaedic Clinic Return  Assessment: Natalie Long is a 87 y.o. female with the following: Chronic right knee pain   Plan: Natalie Long has pain in the right knee.  She typically sees Dr. Hilda Lias, and gets her right knee injections done on a regular basis.  No recent injury.  She continues to have swelling.  She is interested in a steroid injection.  This was completed today without issues.  Procedure note injection Right knee joint   Verbal consent was obtained to inject the right knee joint  Timeout was completed to confirm the site of injection.  The skin was prepped with alcohol and ethyl chloride was sprayed at the injection site.  A 21-gauge needle was used to inject 40 mg of Depo-Medrol and 1% lidocaine (4 cc) into the right knee using an anterolateral approach.  There were no complications. A sterile bandage was applied.   Follow-up: Return if symptoms worsen or fail to improve.   Subjective:  Chief Complaint  Patient presents with   Knee Pain    History of R knee pain started yesterday. Here for injection.     History of Present Illness: Natalie Long is a 87 y.o. female who returns to clinic for repeat evaluation of right knee pain.  She has seen Dr. Hilda Lias for years.  She has chronic right knee pain.  She gets right knee steroid injections completed on a regular basis.  Her most recent injection was more than 3 months ago.  She had acute onset of pain, and is having a lot of pain currently.  No known recent injuries.  Pain is throughout the right knee.  Review of Systems: No fevers or chills No numbness or tingling No chest pain No shortness of breath No bowel or bladder dysfunction No GI distress No headaches   Objective: There were no vitals taken for this visit.  Physical Exam:  Elderly female.  Ambulates with a cane.  She has noticeable tremor  Evaluation of the right knee demonstrates a valgus alignment.  She is swelling, especially medially.   She has diffuse tenderness to palpation.  She can achieve full extension.  She tolerates flexion beyond 90 degrees.  IMAGING: I personally ordered and reviewed the following images:  No new imaging obtained today.  Oliver Barre, MD 02/25/2023 11:25 AM

## 2023-02-25 NOTE — Patient Instructions (Signed)

## 2023-03-09 DIAGNOSIS — N3281 Overactive bladder: Secondary | ICD-10-CM | POA: Diagnosis not present

## 2023-03-09 DIAGNOSIS — E1142 Type 2 diabetes mellitus with diabetic polyneuropathy: Secondary | ICD-10-CM | POA: Diagnosis not present

## 2023-03-09 DIAGNOSIS — E039 Hypothyroidism, unspecified: Secondary | ICD-10-CM | POA: Diagnosis not present

## 2023-03-09 DIAGNOSIS — D519 Vitamin B12 deficiency anemia, unspecified: Secondary | ICD-10-CM | POA: Diagnosis not present

## 2023-03-09 DIAGNOSIS — E559 Vitamin D deficiency, unspecified: Secondary | ICD-10-CM | POA: Diagnosis not present

## 2023-03-09 DIAGNOSIS — G990 Autonomic neuropathy in diseases classified elsewhere: Secondary | ICD-10-CM | POA: Diagnosis not present

## 2023-03-09 DIAGNOSIS — Z0001 Encounter for general adult medical examination with abnormal findings: Secondary | ICD-10-CM | POA: Diagnosis not present

## 2023-03-09 DIAGNOSIS — I10 Essential (primary) hypertension: Secondary | ICD-10-CM | POA: Diagnosis not present

## 2023-03-09 DIAGNOSIS — Z23 Encounter for immunization: Secondary | ICD-10-CM | POA: Diagnosis not present

## 2023-03-09 DIAGNOSIS — E782 Mixed hyperlipidemia: Secondary | ICD-10-CM | POA: Diagnosis not present

## 2023-03-09 DIAGNOSIS — M545 Low back pain, unspecified: Secondary | ICD-10-CM | POA: Diagnosis not present

## 2023-03-12 DIAGNOSIS — S93331D Other subluxation of right foot, subsequent encounter: Secondary | ICD-10-CM | POA: Diagnosis not present

## 2023-03-12 DIAGNOSIS — L110 Acquired keratosis follicularis: Secondary | ICD-10-CM | POA: Diagnosis not present

## 2023-03-12 DIAGNOSIS — M79674 Pain in right toe(s): Secondary | ICD-10-CM | POA: Diagnosis not present

## 2023-03-12 DIAGNOSIS — M79672 Pain in left foot: Secondary | ICD-10-CM | POA: Diagnosis not present

## 2023-03-12 DIAGNOSIS — M79671 Pain in right foot: Secondary | ICD-10-CM | POA: Diagnosis not present

## 2023-03-12 DIAGNOSIS — S93332D Other subluxation of left foot, subsequent encounter: Secondary | ICD-10-CM | POA: Diagnosis not present

## 2023-03-12 DIAGNOSIS — M79675 Pain in left toe(s): Secondary | ICD-10-CM | POA: Diagnosis not present

## 2023-03-12 DIAGNOSIS — I739 Peripheral vascular disease, unspecified: Secondary | ICD-10-CM | POA: Diagnosis not present

## 2023-03-30 DIAGNOSIS — E119 Type 2 diabetes mellitus without complications: Secondary | ICD-10-CM | POA: Diagnosis not present

## 2023-04-07 DIAGNOSIS — Z23 Encounter for immunization: Secondary | ICD-10-CM | POA: Diagnosis not present

## 2023-04-13 DIAGNOSIS — M810 Age-related osteoporosis without current pathological fracture: Secondary | ICD-10-CM | POA: Diagnosis not present

## 2023-06-30 DIAGNOSIS — M79675 Pain in left toe(s): Secondary | ICD-10-CM | POA: Diagnosis not present

## 2023-06-30 DIAGNOSIS — S93332D Other subluxation of left foot, subsequent encounter: Secondary | ICD-10-CM | POA: Diagnosis not present

## 2023-06-30 DIAGNOSIS — M79674 Pain in right toe(s): Secondary | ICD-10-CM | POA: Diagnosis not present

## 2023-06-30 DIAGNOSIS — M79672 Pain in left foot: Secondary | ICD-10-CM | POA: Diagnosis not present

## 2023-06-30 DIAGNOSIS — M79671 Pain in right foot: Secondary | ICD-10-CM | POA: Diagnosis not present

## 2023-06-30 DIAGNOSIS — S93331D Other subluxation of right foot, subsequent encounter: Secondary | ICD-10-CM | POA: Diagnosis not present

## 2023-06-30 DIAGNOSIS — L110 Acquired keratosis follicularis: Secondary | ICD-10-CM | POA: Diagnosis not present

## 2023-06-30 DIAGNOSIS — I739 Peripheral vascular disease, unspecified: Secondary | ICD-10-CM | POA: Diagnosis not present

## 2023-07-06 DIAGNOSIS — D519 Vitamin B12 deficiency anemia, unspecified: Secondary | ICD-10-CM | POA: Diagnosis not present

## 2023-07-06 DIAGNOSIS — E782 Mixed hyperlipidemia: Secondary | ICD-10-CM | POA: Diagnosis not present

## 2023-07-06 DIAGNOSIS — E1142 Type 2 diabetes mellitus with diabetic polyneuropathy: Secondary | ICD-10-CM | POA: Diagnosis not present

## 2023-07-06 DIAGNOSIS — E039 Hypothyroidism, unspecified: Secondary | ICD-10-CM | POA: Diagnosis not present

## 2023-07-06 DIAGNOSIS — M810 Age-related osteoporosis without current pathological fracture: Secondary | ICD-10-CM | POA: Diagnosis not present

## 2023-07-10 DIAGNOSIS — G909 Disorder of the autonomic nervous system, unspecified: Secondary | ICD-10-CM | POA: Diagnosis not present

## 2023-07-10 DIAGNOSIS — I10 Essential (primary) hypertension: Secondary | ICD-10-CM | POA: Diagnosis not present

## 2023-07-10 DIAGNOSIS — E782 Mixed hyperlipidemia: Secondary | ICD-10-CM | POA: Diagnosis not present

## 2023-07-10 DIAGNOSIS — E039 Hypothyroidism, unspecified: Secondary | ICD-10-CM | POA: Diagnosis not present

## 2023-07-10 DIAGNOSIS — G990 Autonomic neuropathy in diseases classified elsewhere: Secondary | ICD-10-CM | POA: Diagnosis not present

## 2023-07-10 DIAGNOSIS — D519 Vitamin B12 deficiency anemia, unspecified: Secondary | ICD-10-CM | POA: Diagnosis not present

## 2023-07-10 DIAGNOSIS — M545 Low back pain, unspecified: Secondary | ICD-10-CM | POA: Diagnosis not present

## 2023-07-10 DIAGNOSIS — N3281 Overactive bladder: Secondary | ICD-10-CM | POA: Diagnosis not present

## 2023-07-10 DIAGNOSIS — E1165 Type 2 diabetes mellitus with hyperglycemia: Secondary | ICD-10-CM | POA: Diagnosis not present

## 2023-07-10 DIAGNOSIS — E1142 Type 2 diabetes mellitus with diabetic polyneuropathy: Secondary | ICD-10-CM | POA: Diagnosis not present

## 2023-07-10 DIAGNOSIS — E559 Vitamin D deficiency, unspecified: Secondary | ICD-10-CM | POA: Diagnosis not present

## 2023-07-23 ENCOUNTER — Ambulatory Visit: Payer: Medicare Other | Admitting: Orthopaedic Surgery

## 2023-07-29 ENCOUNTER — Ambulatory Visit (INDEPENDENT_AMBULATORY_CARE_PROVIDER_SITE_OTHER): Payer: Medicare Other | Admitting: Orthopaedic Surgery

## 2023-07-29 ENCOUNTER — Encounter: Payer: Self-pay | Admitting: Orthopaedic Surgery

## 2023-07-29 VITALS — BP 137/70 | HR 112

## 2023-07-29 DIAGNOSIS — M25561 Pain in right knee: Secondary | ICD-10-CM | POA: Diagnosis not present

## 2023-07-29 DIAGNOSIS — G8929 Other chronic pain: Secondary | ICD-10-CM

## 2023-07-29 NOTE — Progress Notes (Signed)
 PROCEDURE NOTE:  The patient requests injections of the right knee , verbal consent was obtained.  The right knee was prepped appropriately after time out was performed.   Sterile technique was observed and injection of 1 cc of DepoMedrol 40mg  with several cc's of plain xylocaine. Anesthesia was provided by ethyl chloride and a 20-gauge needle was used to inject the knee area. The injection was tolerated well.  A band aid dressing was applied.  The patient was advised to apply ice later today and tomorrow to the injection sight as needed.  Encounter Diagnosis  Name Primary?   Chronic pain of right knee Yes   Return prn.  Call if any problem.  Precautions discussed.  Electronically Signed Darreld Mclean, MD 2/26/20252:29 PM

## 2023-08-10 DIAGNOSIS — M779 Enthesopathy, unspecified: Secondary | ICD-10-CM | POA: Diagnosis not present

## 2023-08-10 DIAGNOSIS — M79674 Pain in right toe(s): Secondary | ICD-10-CM | POA: Diagnosis not present

## 2023-08-10 DIAGNOSIS — S93331D Other subluxation of right foot, subsequent encounter: Secondary | ICD-10-CM | POA: Diagnosis not present

## 2023-08-10 DIAGNOSIS — M79671 Pain in right foot: Secondary | ICD-10-CM | POA: Diagnosis not present

## 2023-10-17 ENCOUNTER — Other Ambulatory Visit: Payer: Self-pay

## 2023-10-17 ENCOUNTER — Ambulatory Visit
Admission: RE | Admit: 2023-10-17 | Discharge: 2023-10-17 | Disposition: A | Discharge status: Home / Self Care | Source: Ambulatory Visit

## 2023-10-17 VITALS — BP 130/84 | HR 98 | Temp 98.6°F | Resp 18

## 2023-10-17 DIAGNOSIS — R0789 Other chest pain: Secondary | ICD-10-CM

## 2023-10-17 DIAGNOSIS — M25561 Pain in right knee: Secondary | ICD-10-CM | POA: Diagnosis not present

## 2023-10-17 DIAGNOSIS — M25512 Pain in left shoulder: Secondary | ICD-10-CM | POA: Diagnosis not present

## 2023-10-17 MED ORDER — DICLOFENAC SODIUM 1 % EX GEL: 2.0000 g | Freq: Four times a day (QID) | CUTANEOUS | 0 refills | Status: AC

## 2023-10-17 MED ORDER — DEXAMETHASONE SODIUM PHOSPHATE 10 MG/ML IJ SOLN
10.0000 mg | Freq: Once | INTRAMUSCULAR | Status: AC
  Administered 2023-10-17: 10 mg via INTRAMUSCULAR

## 2023-10-17 NOTE — ED Triage Notes (Signed)
 Pt reports she has stabbing  left shoulder pain that radiates to her mid to upper back pain x 1 week. States she periodically has chest tightness within the past week as well   Has right knee swelling from previous visit

## 2023-10-17 NOTE — ED Provider Notes (Signed)
 [facility]    CSN: [id number] Arrival date & time: 10/17/23  1138      History   Chief Complaint Chief Complaint  Patient presents with   Shoulder Pain    HPI Natalie Long is a 88 y.o. female.   Patient presenting today with 1 week history of stabbing aching and throbbing left shoulder pain radiating to the periscapular region worse with movement.  States sometimes when the pain is severe it extends toward the left side of her chest.  Denies swelling, redness, numbness, tingling, loss of range of motion, known injury to the area, shortness of breath, wheezing, palpitations, dizziness, headache.  So far trying tramadol  at bedtime which she states takes the edge off of the pain but it comes back in the morning.  She does have a history of significant osteoarthritis and has had steroid injections for this in the past with good relief.  She is also complaining of acute on chronic right knee swelling for which she has gotten steroid treatment in the past prior.    Past Medical History:  Diagnosis Date   Anxiety    Arthritis    Back pain    Constipation    COVID-19    Cystocele    Depression    Diabetes mellitus without complication (HCC)    Gastric reflux    Hyperlipidemia    Hypertension    Rectocele    Thyroid  disease     Patient Active Problem List   Diagnosis Date Noted   Hyperkeratosis 06/23/2013   Metatarsalgia of left foot 03/23/2013   PN (peripheral neuropathy) 03/23/2013   Chronic pain 02/08/2013   Constipation, chronic 08/09/2012   Diabetes with proteinuria 07/24/2012   Osteoporosis 07/13/2012   Callus of foot 05/12/2012   Hypothyroidism 03/12/2012   Osteopenia 09/10/2011   GASTROPARESIS 02/14/2009   INSOMNIA 07/18/2008   DIABETES MELLITUS, TYPE II, CONTROLLED, W/NEURO COMPS 03/30/2008   Anxiety state 06/06/2006   DEPRESSION 06/06/2006   Macular degeneration (senile) of retina 06/06/2006   Essential hypertension 06/06/2006   GERD  06/06/2006   Constipation 06/06/2006   IBS 06/06/2006   Arthropathy 06/06/2006   LOW BACK PAIN 06/06/2006    Past Surgical History:  Procedure Laterality Date   ABDOMINAL HYSTERECTOMY     CATARACT EXTRACTION  2004 and 2008   COLONOSCOPY  2004   Dr Abby Hocking   COLONOSCOPY N/A 08/12/2012   Procedure: COLONOSCOPY;  Surgeon: Suzette Espy, MD;  Location: [facility];  Service: Endoscopy;  Laterality: N/A;  11:30   ESOPHAGOGASTRODUODENOSCOPY  01/01/2009   WUJ:[id number] hiatal hernia.Normal esophagus.Antral nodule, status post biopsy    OB History     Gravida  1   Para  1   Term  1   Preterm      AB      Living  1      SAB      IAB      Ectopic      Multiple      Live Births               Home Medications    Prior to Admission medications   Medication Sig Start Date End Date Taking? Authorizing Provider  diclofenac  Sodium (VOLTAREN  ARTHRITIS PAIN) 1 % GEL Apply 2 g topically 4 (four) times daily. 10/17/23  Yes Corbin Dess, PA-C  GEMTESA 75 MG TABS Take 1 tablet by mouth daily. 10/02/23  Yes [provider]  pravastatin (PRAVACHOL) 10  MG tablet SMARTSIG:1 Tablet(s) By Mouth 09/23/23  Yes [provider]  traMADol  (ULTRAM ) 50 MG tablet Take 50 mg by mouth every 6 (six) hours as needed. 07/27/23  Yes [provider]  ALPRAZolam (XANAX) 0.5 MG tablet Take 0.5 mg by mouth at bedtime.  12/24/17   [provider]  amLODipine -olmesartan  (AZOR ) 5-20 MG tablet Take 1 tablet by mouth daily.  06/18/15   [provider]  Calcium Carbonate-Vitamin D 600-400 MG-UNIT tablet Take 2 tablets by mouth daily.    [provider]  clobetasol  cream (TEMOVATE ) 0.05 % APPLY TO AFFECTED AREAS TWICE DAILY. 04/04/19   Wendelyn Halter, MD  cyanocobalamin  1000 MCG tablet Take 1,000 mcg by mouth daily.    [provider]  diclofenac  Sodium (VOLTAREN ) 1 % GEL Apply 4 g topically 4 (four) times daily. 07/30/21    Graham, Laura E, PA-C  diclofenac  Sodium (VOLTAREN ) 1 % GEL Apply 2 g topically 4 (four) times daily. 12/03/22   Corbin Dess, PA-C  fluticasone  (FLONASE ) 50 MCG/ACT nasal spray Place 2 sprays into both nostrils daily. 04/13/22   Leath-Warren, Belen Bowers, NP  hydrochlorothiazide  (HYDRODIURIL ) 25 MG tablet Take 1 tablet (25 mg total) by mouth daily. 10/06/12   Mathis Som, MD  levothyroxine  (SYNTHROID , LEVOTHROID) 75 MCG tablet Take 75 mcg by mouth daily before breakfast.    [provider]  metFORMIN (GLUCOPHAGE) 500 MG tablet Take 500 mg by mouth 2 (two) times daily with a meal.    [provider]  Multiple Vitamins-Minerals (VISION-VITE PRESERVE PO) Take by mouth.    [provider]  omeprazole (PRILOSEC) 20 MG capsule Take 20 mg by mouth daily.  06/18/15   [provider]  ondansetron  (ZOFRAN -ODT) 4 MG disintegrating tablet Take 1 tablet (4 mg total) by mouth every 8 (eight) hours as needed for nausea or vomiting. 04/13/22   Leath-Warren, Belen Bowers, NP  ONE TOUCH ULTRA TEST test strip  11/03/17   [provider]  pantoprazole (PROTONIX) 40 MG tablet Take 40 mg by mouth daily. 08/21/20   [provider]  PROLIA 60 MG/ML SOSY injection SMARTSIG:1 Milliliter(s) SUB-Q Twice a Year 10/07/22   [provider]  Vitamin D, Ergocalciferol, (DRISDOL) 1.25 MG (50000 UNIT) CAPS capsule Take 50,000 Units by mouth every 7 (seven) days.    [provider]    Family History Family History  Problem Relation Age of Onset   Osteoporosis Mother    High blood pressure Mother    Heart disease Father    Hyperlipidemia Father    Hypertension Father    Diabetes Father    High blood pressure Father    Depression Sister    Cancer Sister        metastatic   Diabetes Sister    Colon cancer Sister     Social History Social History   Tobacco Use   Smoking status: Former   Smokeless tobacco: Never  Vaping Use   Vaping status:  Never Used  Substance Use Topics   Alcohol use: No   Drug use: No     Allergies   Codeine   Review of Systems Review of Systems Per HPI  Physical Exam Triage Vital Signs ED Triage Vitals  Encounter Vitals Group     BP 10/17/23 1206 130/84     Systolic BP Percentile --      Diastolic BP Percentile --      Pulse Rate 10/17/23 1206 98  Resp 10/17/23 1206 18     Temp 10/17/23 1206 98.6 F (37 C)     Temp Source 10/17/23 1206 Oral     SpO2 10/17/23 1206 93 %     Weight --      Height --      Head Circumference --      Peak Flow --      Pain Score 10/17/23 1203 10     Pain Loc --      Pain Education --      Exclude from Growth Chart --    No data found.  Updated Vital Signs BP 130/84 (BP Location: Right Arm)   Pulse 98   Temp 98.6 F (37 C) (Oral)   Resp 18   SpO2 93%   Visual Acuity Right Eye Distance:   Left Eye Distance:   Bilateral Distance:    Right Eye Near:   Left Eye Near:    Bilateral Near:     Physical Exam Vitals and nursing note reviewed.  Constitutional:      Appearance: Normal appearance. She is not ill-appearing.  HENT:     Head: Atraumatic.  Eyes:     Extraocular Movements: Extraocular movements intact.     Conjunctiva/sclera: Conjunctivae normal.  Cardiovascular:     Rate and Rhythm: Normal rate and regular rhythm.     Heart sounds: Normal heart sounds.  Pulmonary:     Effort: Pulmonary effort is normal.     Breath sounds: Normal breath sounds.  Musculoskeletal:        General: Tenderness present. No swelling or deformity. Normal range of motion.     Cervical back: Normal range of motion and neck supple.     Comments: Diffuse tenderness to palpation to left shoulder region extending to left upper back musculature and periscapular region.  No midline spinal tenderness to palpation diffusely.  Grip strength full and equal bilateral hands.  C-spine with full range of motion.  Left chest wall tender to palpation reproducing pain   Skin:    General: Skin is warm and dry.     Findings: No bruising or erythema.  Neurological:     Mental Status: She is alert and oriented to person, place, and time.     Motor: No weakness.     Gait: Gait normal.     Comments: Left upper extremity neurovascularly intact  Psychiatric:        Mood and Affect: Mood normal.        Thought Content: Thought content normal.        Judgment: Judgment normal.      UC Treatments / Results  Labs (all labs ordered are listed, but only abnormal results are displayed) Labs Reviewed - No data to display  EKG   Radiology No results found.  Procedures Procedures (including critical care time)  Medications Ordered in UC Medications  dexamethasone  (DECADRON ) injection 10 mg (10 mg Intramuscular Given 10/17/23 1256)    Initial Impression / Assessment and Plan / UC Course  I have reviewed the triage vital signs and the nursing notes.  Pertinent labs & imaging results that were available during my care of the patient were reviewed by me and considered in my medical decision making (see chart for details).     Vital signs and exam very reassuring today, EKG showing sinus rhythm with PVCs and PACs, nonspecific ST wave changes however significant artifact given hand tremor.  No significant change when compared to prior apart from  artifact from tremor.  Her pain is reproducible within her chest wall on palpation and only occurs when the shoulder pain is severe, shoulder pain and knee pain are consistent with osteoarthritis.  Treat with IM Decadron , Voltaren  gel, supportive over-the-counter medication and home care.  Did recommend ED follow-up for worsening chest pain symptoms, PCP follow-up next week for recheck.  Final Clinical Impressions(s) / UC Diagnoses   Final diagnoses:  Acute pain of left shoulder  Chest wall pain  Acute pain of right knee     Discharge Instructions      Your workup reassuring today, however as we discussed if  your chest pain gets worse or changes go to the emergency department for further evaluation.  I suspect arthritis flare for your shoulder pain.  We have given you a steroid shot for pain and inflammation today and a topical anti-inflammatory gel to rub on areas of pain.  Follow-up with your primary care provider next week for a recheck  ED Prescriptions     Medication Sig Dispense Auth. Provider   diclofenac  Sodium (VOLTAREN  ARTHRITIS PAIN) 1 % GEL Apply 2 g topically 4 (four) times daily. 150 g Corbin Dess, [place]      PDMP not reviewed this encounter.   Corbin Dess, PA-C 10/17/23 1257

## 2023-10-17 NOTE — Discharge Instructions (Signed)
 Your workup reassuring today, however as we discussed if your chest pain gets worse or changes go to the emergency department for further evaluation.  I suspect arthritis flare for your shoulder pain.  We have given you a steroid shot for pain and inflammation today and a topical anti-inflammatory gel to rub on areas of pain.  Follow-up with your primary care provider next week for a recheck

## 2023-10-18 ENCOUNTER — Telehealth: Payer: Self-pay | Admitting: Emergency Medicine

## 2023-10-18 NOTE — Telephone Encounter (Signed)
 Pt called and inquired about EKG results. Consulted provider and reported "exam was reassuring yesterday and did not see any acute changes but cardiac event could not be ruled out, if chest pain returns or worsens recommend follow-up in ED." Discussed with pt, pt verbalized understanding.   Pt also inquired about BP. RN reviewed chart and BP WNL at time of UC visit. Pt aware and verbalized understanding.

## 2023-11-06 DIAGNOSIS — M810 Age-related osteoporosis without current pathological fracture: Secondary | ICD-10-CM | POA: Diagnosis not present

## 2023-11-06 DIAGNOSIS — D519 Vitamin B12 deficiency anemia, unspecified: Secondary | ICD-10-CM | POA: Diagnosis not present

## 2023-11-06 DIAGNOSIS — E1142 Type 2 diabetes mellitus with diabetic polyneuropathy: Secondary | ICD-10-CM | POA: Diagnosis not present

## 2023-11-06 DIAGNOSIS — E782 Mixed hyperlipidemia: Secondary | ICD-10-CM | POA: Diagnosis not present

## 2023-11-06 DIAGNOSIS — E039 Hypothyroidism, unspecified: Secondary | ICD-10-CM | POA: Diagnosis not present

## 2023-11-11 ENCOUNTER — Encounter: Payer: Self-pay | Admitting: Orthopaedic Surgery

## 2023-11-11 ENCOUNTER — Ambulatory Visit (INDEPENDENT_AMBULATORY_CARE_PROVIDER_SITE_OTHER): Admitting: Orthopaedic Surgery

## 2023-11-11 DIAGNOSIS — M25561 Pain in right knee: Secondary | ICD-10-CM

## 2023-11-11 DIAGNOSIS — M25512 Pain in left shoulder: Secondary | ICD-10-CM | POA: Diagnosis not present

## 2023-11-11 DIAGNOSIS — G8929 Other chronic pain: Secondary | ICD-10-CM | POA: Diagnosis not present

## 2023-11-11 MED ORDER — METHYLPREDNISOLONE ACETATE 40 MG/ML IJ SUSP
40.0000 mg | Freq: Once | INTRAMUSCULAR | Status: AC
  Administered 2023-11-11: 40 mg via INTRA_ARTICULAR

## 2023-11-11 NOTE — Progress Notes (Signed)
 PROCEDURE NOTE:  The patient requests injections of the right knee , verbal consent was obtained.  The right knee was prepped appropriately after time out was performed.   Sterile technique was observed and injection of 1 cc of DepoMedrol 40mg  with several cc's of plain xylocaine. Anesthesia was provided by ethyl chloride and a 20-gauge needle was used to inject the knee area. The injection was tolerated well.  A band aid dressing was applied.  The patient was advised to apply ice later today and tomorrow to the injection sight as needed.  PROCEDURE NOTE:  The patient request injection, verbal consent was obtained.  The left shoulder was prepped appropriately after time out was performed.   Sterile technique was observed and injection of 1 cc of DepoMedrol 40mg  with several cc's of plain xylocaine. Anesthesia was provided by ethyl chloride and a 20-gauge needle was used to inject the shoulder area. A posterior approach was used.  The injection was tolerated well.  A band aid dressing was applied.  The patient was advised to apply ice later today and tomorrow to the injection sight as needed.  Encounter Diagnoses  Name Primary?   Chronic pain of right knee Yes   Chronic pain in left shoulder    Return in one month.  Call if any problem.  Precautions discussed.  Electronically Signed Pleasant Brilliant, MD 6/11/202510:26 AM

## 2023-11-11 NOTE — Addendum Note (Signed)
 Addended by: [place] Snow T on: 11/11/2023 11:59 AM   Modules accepted: Orders

## 2023-11-13 DIAGNOSIS — I10 Essential (primary) hypertension: Secondary | ICD-10-CM | POA: Diagnosis not present

## 2023-11-13 DIAGNOSIS — E1159 Type 2 diabetes mellitus with other circulatory complications: Secondary | ICD-10-CM | POA: Diagnosis not present

## 2023-11-13 DIAGNOSIS — E559 Vitamin D deficiency, unspecified: Secondary | ICD-10-CM | POA: Diagnosis not present

## 2023-11-13 DIAGNOSIS — E1165 Type 2 diabetes mellitus with hyperglycemia: Secondary | ICD-10-CM | POA: Diagnosis not present

## 2023-11-13 DIAGNOSIS — E782 Mixed hyperlipidemia: Secondary | ICD-10-CM | POA: Diagnosis not present

## 2023-11-13 DIAGNOSIS — E1143 Type 2 diabetes mellitus with diabetic autonomic (poly)neuropathy: Secondary | ICD-10-CM | POA: Diagnosis not present

## 2023-11-13 DIAGNOSIS — D519 Vitamin B12 deficiency anemia, unspecified: Secondary | ICD-10-CM | POA: Diagnosis not present

## 2023-11-13 DIAGNOSIS — E039 Hypothyroidism, unspecified: Secondary | ICD-10-CM | POA: Diagnosis not present

## 2023-11-13 DIAGNOSIS — G8929 Other chronic pain: Secondary | ICD-10-CM | POA: Diagnosis not present

## 2023-11-13 DIAGNOSIS — E1142 Type 2 diabetes mellitus with diabetic polyneuropathy: Secondary | ICD-10-CM | POA: Diagnosis not present

## 2023-11-13 DIAGNOSIS — G990 Autonomic neuropathy in diseases classified elsewhere: Secondary | ICD-10-CM | POA: Diagnosis not present

## 2023-11-16 DIAGNOSIS — M79675 Pain in left toe(s): Secondary | ICD-10-CM | POA: Diagnosis not present

## 2023-11-16 DIAGNOSIS — M79672 Pain in left foot: Secondary | ICD-10-CM | POA: Diagnosis not present

## 2023-11-16 DIAGNOSIS — S93332D Other subluxation of left foot, subsequent encounter: Secondary | ICD-10-CM | POA: Diagnosis not present

## 2023-11-16 DIAGNOSIS — M79671 Pain in right foot: Secondary | ICD-10-CM | POA: Diagnosis not present

## 2023-11-16 DIAGNOSIS — M79674 Pain in right toe(s): Secondary | ICD-10-CM | POA: Diagnosis not present

## 2023-11-16 DIAGNOSIS — L110 Acquired keratosis follicularis: Secondary | ICD-10-CM | POA: Diagnosis not present

## 2023-11-16 DIAGNOSIS — I739 Peripheral vascular disease, unspecified: Secondary | ICD-10-CM | POA: Diagnosis not present

## 2023-11-16 DIAGNOSIS — S93331D Other subluxation of right foot, subsequent encounter: Secondary | ICD-10-CM | POA: Diagnosis not present

## 2023-12-09 ENCOUNTER — Ambulatory Visit: Admitting: Orthopaedic Surgery

## 2023-12-20 ENCOUNTER — Ambulatory Visit
Admission: EM | Admit: 2023-12-20 | Discharge: 2023-12-20 | Disposition: A | Discharge status: Home / Self Care | Attending: Nurse Practitioner | Admitting: Nurse Practitioner

## 2023-12-20 ENCOUNTER — Ambulatory Visit (HOSPITAL_COMMUNITY)
Admission: RE | Admit: 2023-12-20 | Discharge: 2023-12-20 | Disposition: A | Discharge status: Home / Self Care | Source: Ambulatory Visit | Attending: Nurse Practitioner

## 2023-12-20 ENCOUNTER — Telehealth: Payer: Self-pay | Admitting: Nurse Practitioner

## 2023-12-20 DIAGNOSIS — M19011 Primary osteoarthritis, right shoulder: Secondary | ICD-10-CM | POA: Diagnosis not present

## 2023-12-20 DIAGNOSIS — K449 Diaphragmatic hernia without obstruction or gangrene: Secondary | ICD-10-CM | POA: Insufficient documentation

## 2023-12-20 DIAGNOSIS — M25511 Pain in right shoulder: Secondary | ICD-10-CM | POA: Diagnosis not present

## 2023-12-20 DIAGNOSIS — M8588 Other specified disorders of bone density and structure, other site: Secondary | ICD-10-CM | POA: Insufficient documentation

## 2023-12-20 DIAGNOSIS — M8580 Other specified disorders of bone density and structure, unspecified site: Secondary | ICD-10-CM | POA: Diagnosis not present

## 2023-12-20 DIAGNOSIS — R0781 Pleurodynia: Secondary | ICD-10-CM

## 2023-12-20 DIAGNOSIS — M79621 Pain in right upper arm: Secondary | ICD-10-CM

## 2023-12-20 DIAGNOSIS — W19XXXA Unspecified fall, initial encounter: Secondary | ICD-10-CM | POA: Diagnosis not present

## 2023-12-20 DIAGNOSIS — I700 Atherosclerosis of aorta: Secondary | ICD-10-CM | POA: Diagnosis not present

## 2023-12-20 NOTE — Discharge Instructions (Addendum)
 Go to [facility] for x-rays.  You will need to register through the emergency department to have your x-rays performed.  I will contact you when the results of the x-ray are received. You may take over-the-counter Tylenol as needed for pain or discomfort. RICE therapy, rest, ice, compression, and elevation.  Apply ice for 20 minutes, remove for 1 hour, repeat as needed. Make sure you are coughing and deep breathing normally to help prevent the development of pneumonia.  You may use a pillow to splint to help with coughing and deep breathing. Recommend follow-up with your PCP this week for reevaluation. Follow-up as needed.

## 2023-12-20 NOTE — Telephone Encounter (Signed)
 Reviewed patient's x-ray results of the right shoulder, right humerus, and left rib cage.  Called daughter, Vina Shed, to discuss patient's x-ray results.  Verified the patient's identification with 2 patient identifiers.  Patient's daughter was advised the x-rays are negative for fracture or dislocation; they do show osteoarthritic changes.  Patient's daughter was advised to continue current treatment recommendations to include RICE therapy and over-the-counter Tylenol.  Daughter was advised to follow-up with patient's PCP or with orthopedics if symptoms are not improving over the next several weeks.  Daughter was in agreement with this plan of care and verbalized understanding.  All questions were answered.

## 2023-12-20 NOTE — ED Provider Notes (Signed)
 [facility]    CSN: [id number] Arrival date & time: 12/20/23  1102      History   Chief Complaint No chief complaint on file.   HPI Natalie Long is a 88 y.o. female.   The history is provided by the patient and a relative.   Patient brought in by family for complaints of fall that occurred 1 day ago.  Patient presents with complaints of right shoulder pain, right upper arm pain, and left-sided rib cage pain.  Patient states that she hit fell onto her shoulder directly.  She denies swelling, bruising, or erythema.  Patient states that she does have pain with certain movement of the right shoulder, she also states that the pain radiates down into the right forearm/hand.  Patient also complains of pain to the left-sided rib cage.  She states that the area is tender to touch.  She states that she has not had any redness, swelling, or bruising to the site.  Patient also reports that she has a skin tear to her right forearm.  Patient denies loss of consciousness, use of blood thinning medication, shortness of breath, difficulty breathing, or chest pain.  Patient does ambulate with a cane at baseline. Past Medical History:  Diagnosis Date   Anxiety    Arthritis    Back pain    Constipation    COVID-19    Cystocele    Depression    Diabetes mellitus without complication (HCC)    Gastric reflux    Hyperlipidemia    Hypertension    Rectocele    Thyroid  disease     Patient Active Problem List   Diagnosis Date Noted   Hyperkeratosis 06/23/2013   Metatarsalgia of left foot 03/23/2013   PN (peripheral neuropathy) 03/23/2013   Chronic pain 02/08/2013   Constipation, chronic 08/09/2012   Diabetes with proteinuria 07/24/2012   Osteoporosis 07/13/2012   Callus of foot 05/12/2012   Hypothyroidism 03/12/2012   Osteopenia 09/10/2011   GASTROPARESIS 02/14/2009   INSOMNIA 07/18/2008   DIABETES MELLITUS, TYPE II, CONTROLLED, W/NEURO COMPS 03/30/2008   Anxiety state  06/06/2006   DEPRESSION 06/06/2006   Macular degeneration (senile) of retina 06/06/2006   Essential hypertension 06/06/2006   GERD 06/06/2006   Constipation 06/06/2006   IBS 06/06/2006   Arthropathy 06/06/2006   LOW BACK PAIN 06/06/2006    Past Surgical History:  Procedure Laterality Date   ABDOMINAL HYSTERECTOMY     CATARACT EXTRACTION  2004 and 2008   COLONOSCOPY  2004   Dr Emmer   COLONOSCOPY N/A 08/12/2012   Procedure: COLONOSCOPY;  Surgeon: Lamar CHRISTELLA Hollingshead, MD;  Location: [facility];  Service: Endoscopy;  Laterality: N/A;  11:30   ESOPHAGOGASTRODUODENOSCOPY  01/01/2009   MFM:Fnizmjuz-dpszi hiatal hernia.Normal esophagus.Antral nodule, status post biopsy    OB History     Gravida  1   Para  1   Term  1   Preterm      AB      Living  1      SAB      IAB      Ectopic      Multiple      Live Births               Home Medications    Prior to Admission medications   Medication Sig Start Date End Date Taking? Authorizing Provider  ALPRAZolam (XANAX) 0.5 MG tablet Take 0.5 mg by mouth at bedtime.  12/24/17   [provider]  amLODipine -olmesartan  (AZOR ) 5-20 MG tablet Take 1 tablet by mouth daily.  06/18/15   [provider]  Calcium Carbonate-Vitamin D 600-400 MG-UNIT tablet Take 2 tablets by mouth daily.    [provider]  clobetasol  cream (TEMOVATE ) 0.05 % APPLY TO AFFECTED AREAS TWICE DAILY. 04/04/19   Jayne Vonn DEL, MD  cyanocobalamin  1000 MCG tablet Take 1,000 mcg by mouth daily.    [provider]  diclofenac  Sodium (VOLTAREN  ARTHRITIS PAIN) 1 % GEL Apply 2 g topically 4 (four) times daily. 10/17/23   Stuart Vernell Norris, PA-C  diclofenac  Sodium (VOLTAREN ) 1 % GEL Apply 4 g topically 4 (four) times daily. 07/30/21   Graham, Laura E, PA-C  diclofenac  Sodium (VOLTAREN ) 1 % GEL Apply 2 g topically 4 (four) times daily. 12/03/22   Stuart Vernell Norris, PA-C  fluticasone  (FLONASE ) 50 MCG/ACT nasal spray  Place 2 sprays into both nostrils daily. 04/13/22   Leath-Warren, Etta PARAS, NP  GEMTESA 75 MG TABS Take 1 tablet by mouth daily. 10/02/23   [provider]  hydrochlorothiazide  (HYDRODIURIL ) 25 MG tablet Take 1 tablet (25 mg total) by mouth daily. 10/06/12   Bari Theodoro FALCON, MD  levothyroxine  (SYNTHROID , LEVOTHROID) 75 MCG tablet Take 75 mcg by mouth daily before breakfast.    [provider]  metFORMIN (GLUCOPHAGE) 500 MG tablet Take 500 mg by mouth 2 (two) times daily with a meal.    [provider]  Multiple Vitamins-Minerals (VISION-VITE PRESERVE PO) Take by mouth.    [provider]  omeprazole (PRILOSEC) 20 MG capsule Take 20 mg by mouth daily.  06/18/15   [provider]  ondansetron  (ZOFRAN -ODT) 4 MG disintegrating tablet Take 1 tablet (4 mg total) by mouth every 8 (eight) hours as needed for nausea or vomiting. 04/13/22   Leath-Warren, Etta PARAS, NP  ONE TOUCH ULTRA TEST test strip  11/03/17   [provider]  pantoprazole (PROTONIX) 40 MG tablet Take 40 mg by mouth daily. 08/21/20   [provider]  pravastatin (PRAVACHOL) 10 MG tablet SMARTSIG:1 Tablet(s) By Mouth 09/23/23   [provider]  PROLIA 60 MG/ML SOSY injection SMARTSIG:1 Milliliter(s) SUB-Q Twice a Year 10/07/22   [provider]  traMADol  (ULTRAM ) 50 MG tablet Take 50 mg by mouth every 6 (six) hours as needed. 07/27/23   [provider]  Vitamin D, Ergocalciferol, (DRISDOL) 1.25 MG (50000 UNIT) CAPS capsule Take 50,000 Units by mouth every 7 (seven) days.    [provider]    Family History Family History  Problem Relation Age of Onset   Osteoporosis Mother    High blood pressure Mother    Heart disease Father    Hyperlipidemia Father    Hypertension Father    Diabetes Father    High blood pressure Father    Depression Sister    Cancer Sister        metastatic   Diabetes Sister    Colon cancer Sister     Social  History Social History   Tobacco Use   Smoking status: Former   Smokeless tobacco: Never  Vaping Use   Vaping status: Never Used  Substance Use Topics   Alcohol use: No   Drug use: No     Allergies   Codeine   Review of Systems Review of Systems Per HPI  Physical Exam Triage Vital Signs ED Triage Vitals  Encounter Vitals Group     BP 12/20/23 1114 128/71  Girls Systolic BP Percentile --      Girls Diastolic BP Percentile --      Boys Systolic BP Percentile --      Boys Diastolic BP Percentile --      Pulse Rate 12/20/23 1114 99     Resp 12/20/23 1114 18     Temp 12/20/23 1114 98 F (36.7 C)     Temp Source 12/20/23 1114 Oral     SpO2 12/20/23 1114 95 %     Weight --      Height --      Head Circumference --      Peak Flow --      Pain Score 12/20/23 1116 9     Pain Loc --      Pain Education --      Exclude from Growth Chart --    No data found.  Updated Vital Signs BP 128/71 (BP Location: Right Arm)   Pulse 99   Temp 98 F (36.7 C) (Oral)   Resp 18   SpO2 95%   Visual Acuity Right Eye Distance:   Left Eye Distance:   Bilateral Distance:    Right Eye Near:   Left Eye Near:    Bilateral Near:     Physical Exam Vitals and nursing note reviewed.  Constitutional:      General: She is not in acute distress.    Appearance: Normal appearance.  HENT:     Head: Normocephalic.  Eyes:     Extraocular Movements: Extraocular movements intact.     Pupils: Pupils are equal, round, and reactive to light.  Cardiovascular:     Rate and Rhythm: Normal rate and regular rhythm.     Pulses: Normal pulses.     Heart sounds: Normal heart sounds.  Pulmonary:     Effort: Pulmonary effort is normal. No respiratory distress.     Breath sounds: Normal breath sounds. No stridor. No wheezing, rhonchi or rales.  Chest:     Chest wall: Tenderness present. No deformity, swelling or edema.    Abdominal:     General: Bowel sounds are normal.     Palpations:  Abdomen is soft.  Musculoskeletal:     Right shoulder: Tenderness (glenohumeral tenderness) and bony tenderness (glenohumeral tenderness) present. No swelling or deformity. Decreased range of motion (Increased pain with internal and external rotation and extension.). Decreased strength (d/t pain). Normal pulse.     Right upper arm: Tenderness present. No swelling, deformity or bony tenderness.     Cervical back: Normal range of motion.  Skin:    General: Skin is warm and dry.      Neurological:     General: No focal deficit present.     Mental Status: She is alert and oriented to person, place, and time.  Psychiatric:        Mood and Affect: Mood normal.        Behavior: Behavior normal.      UC Treatments / Results  Labs (all labs ordered are listed, but only abnormal results are displayed) Labs Reviewed - No data to display  EKG   Radiology No results found.  Procedures Procedures (including critical care time)  Medications Ordered in UC Medications - No data to display  Initial Impression / Assessment and Plan / UC Course  I have reviewed the triage vital signs and the nursing notes.  Pertinent labs & imaging results that were available during my care of the patient were reviewed by  me and considered in my medical decision making (see chart for details).   X-ray of the right shoulder, right humerus, and left rib cage are pending.  At a minimum, if x-rays are negative, patient most likely experiencing contusions to the affected areas.  Supportive care recommendations were provided and discussed with patient's family to include over-the-counter analgesics, and RICE therapy.  Family was advised regarding follow-up.  Patient was also encouraged to cough and deep breath to prevent the development of pneumonia.  Family was in agreement with this plan of care and verbalized understanding.  All questions were answered.  Patient stable for discharge.  Final Clinical Impressions(s)  / UC Diagnoses   Final diagnoses:  None   Discharge Instructions   None    ED Prescriptions   None    PDMP not reviewed this encounter.   Gilmer Etta PARAS, NP 12/20/23 1147

## 2023-12-20 NOTE — ED Triage Notes (Signed)
 Pt reports she has a high area at her house and when she went to step off of her porch she missed a step and feel injuring her right shoulder and left side rib cage x 1 day.   Pt has a small skin tare on her right forearm

## 2023-12-21 ENCOUNTER — Ambulatory Visit (HOSPITAL_COMMUNITY): Payer: Self-pay

## 2024-01-01 ENCOUNTER — Telehealth: Payer: Self-pay

## 2024-01-01 NOTE — Telephone Encounter (Signed)
 Up to date on meds, next review in a couple weeks

## 2024-02-03 ENCOUNTER — Encounter: Payer: Self-pay | Admitting: Orthopaedic Surgery

## 2024-02-03 ENCOUNTER — Ambulatory Visit (INDEPENDENT_AMBULATORY_CARE_PROVIDER_SITE_OTHER): Admitting: Orthopaedic Surgery

## 2024-02-03 DIAGNOSIS — M25561 Pain in right knee: Secondary | ICD-10-CM

## 2024-02-03 DIAGNOSIS — G8929 Other chronic pain: Secondary | ICD-10-CM | POA: Diagnosis not present

## 2024-02-03 MED ORDER — METHYLPREDNISOLONE ACETATE 40 MG/ML IJ SUSP
40.0000 mg | Freq: Once | INTRAMUSCULAR | Status: AC
  Administered 2024-02-03: 40 mg via INTRA_ARTICULAR

## 2024-02-03 NOTE — Progress Notes (Signed)
 PROCEDURE NOTE:  The patient requests injections of the right knee , verbal consent was obtained.  The right knee was prepped appropriately after time out was performed.   Sterile technique was observed and injection of 1 cc of DepoMedrol 40mg  with several cc's of plain xylocaine. Anesthesia was provided by ethyl chloride and a 20-gauge needle was used to inject the knee area. The injection was tolerated well.  A band aid dressing was applied.  The patient was advised to apply ice later today and tomorrow to the injection sight as needed.  Encounter Diagnosis  Name Primary?   Chronic pain of right knee Yes   Return prn.  I have informed the patient I will be retiring from medical practice and from this office on March 03, 2024.  The patient has been offered continuing care with Dr. Margrette or Dr. Onesimo of this office.  The patient may choose another provider and the records will be forwarded after proper signature and notification.  Patient understands and agrees.  She would like to see Dr. Margrette in followup.  Return prn.  Call if any problem.  Precautions discussed.  Electronically Signed Lemond Stable, MD 9/3/202511:05 AM

## 2024-02-03 NOTE — Addendum Note (Signed)
 Addended by: MARCINE HUSBAND T on: 02/03/2024 11:30 AM   Modules accepted: Orders
